# Patient Record
Sex: Female | Born: 1940 | Race: White | Hispanic: No | Marital: Married | Smoking: Former smoker
Health system: Southern US, Community
[De-identification: ages and names within clinical notes are randomized; demographics above are authoritative.]

## PROBLEM LIST (undated history)

## (undated) DIAGNOSIS — K449 Diaphragmatic hernia without obstruction or gangrene: Secondary | ICD-10-CM

## (undated) DIAGNOSIS — K649 Unspecified hemorrhoids: Secondary | ICD-10-CM

## (undated) DIAGNOSIS — K2970 Gastritis, unspecified, without bleeding: Secondary | ICD-10-CM

## (undated) DIAGNOSIS — I10 Essential (primary) hypertension: Secondary | ICD-10-CM

## (undated) DIAGNOSIS — Z87898 Personal history of other specified conditions: Secondary | ICD-10-CM

## (undated) DIAGNOSIS — H409 Unspecified glaucoma: Secondary | ICD-10-CM

## (undated) DIAGNOSIS — Z87442 Personal history of urinary calculi: Secondary | ICD-10-CM

## (undated) DIAGNOSIS — M1990 Unspecified osteoarthritis, unspecified site: Secondary | ICD-10-CM

## (undated) DIAGNOSIS — E785 Hyperlipidemia, unspecified: Secondary | ICD-10-CM

## (undated) DIAGNOSIS — K219 Gastro-esophageal reflux disease without esophagitis: Secondary | ICD-10-CM

## (undated) DIAGNOSIS — F419 Anxiety disorder, unspecified: Secondary | ICD-10-CM

## (undated) DIAGNOSIS — K589 Irritable bowel syndrome without diarrhea: Secondary | ICD-10-CM

## (undated) DIAGNOSIS — K5790 Diverticulosis of intestine, part unspecified, without perforation or abscess without bleeding: Secondary | ICD-10-CM

## (undated) DIAGNOSIS — Z8701 Personal history of pneumonia (recurrent): Secondary | ICD-10-CM

## (undated) HISTORY — DX: Unspecified osteoarthritis, unspecified site: M19.90

## (undated) HISTORY — DX: Essential (primary) hypertension: I10

## (undated) HISTORY — DX: Personal history of urinary calculi: Z87.442

## (undated) HISTORY — DX: Irritable bowel syndrome, unspecified: K58.9

## (undated) HISTORY — DX: Personal history of other specified conditions: Z87.898

## (undated) HISTORY — DX: Unspecified glaucoma: H40.9

## (undated) HISTORY — DX: Anxiety disorder, unspecified: F41.9

## (undated) HISTORY — DX: Unspecified hemorrhoids: K64.9

## (undated) HISTORY — PX: BLADDER REPAIR: SHX76

## (undated) HISTORY — DX: Gastro-esophageal reflux disease without esophagitis: K21.9

## (undated) HISTORY — PX: KIDNEY STONE SURGERY: SHX686

## (undated) HISTORY — DX: Gastritis, unspecified, without bleeding: K29.70

## (undated) HISTORY — PX: CHOLECYSTECTOMY: SHX55

## (undated) HISTORY — PX: APPENDECTOMY: SHX54

## (undated) HISTORY — DX: Diverticulosis of intestine, part unspecified, without perforation or abscess without bleeding: K57.90

## (undated) HISTORY — PX: TONSILLECTOMY: SHX5217

## (undated) HISTORY — DX: Hyperlipidemia, unspecified: E78.5

## (undated) HISTORY — DX: Diaphragmatic hernia without obstruction or gangrene: K44.9

## (undated) HISTORY — DX: Personal history of pneumonia (recurrent): Z87.01

---

## 1998-07-25 ENCOUNTER — Encounter: Payer: Self-pay | Admitting: Urology

## 1998-07-25 ENCOUNTER — Ambulatory Visit (HOSPITAL_COMMUNITY): Admission: RE | Admit: 1998-07-25 | Discharge: 1998-07-25 | Payer: Self-pay | Admitting: Urology

## 1998-07-27 ENCOUNTER — Encounter: Payer: Self-pay | Admitting: Urology

## 1998-07-27 ENCOUNTER — Ambulatory Visit (HOSPITAL_COMMUNITY): Admission: RE | Admit: 1998-07-27 | Discharge: 1998-07-27 | Payer: Self-pay | Admitting: Urology

## 1998-08-15 ENCOUNTER — Encounter: Payer: Self-pay | Admitting: Urology

## 1998-08-15 ENCOUNTER — Ambulatory Visit (HOSPITAL_COMMUNITY): Admission: RE | Admit: 1998-08-15 | Discharge: 1998-08-15 | Payer: Self-pay | Admitting: Urology

## 1998-09-06 ENCOUNTER — Encounter: Payer: Self-pay | Admitting: Urology

## 1998-09-06 ENCOUNTER — Ambulatory Visit (HOSPITAL_COMMUNITY): Admission: RE | Admit: 1998-09-06 | Discharge: 1998-09-06 | Payer: Self-pay | Admitting: Urology

## 1998-09-26 ENCOUNTER — Encounter: Payer: Self-pay | Admitting: Urology

## 1998-09-26 ENCOUNTER — Ambulatory Visit (HOSPITAL_COMMUNITY): Admission: RE | Admit: 1998-09-26 | Discharge: 1998-09-26 | Payer: Self-pay | Admitting: Urology

## 1998-12-20 ENCOUNTER — Other Ambulatory Visit: Admission: RE | Admit: 1998-12-20 | Discharge: 1998-12-20 | Payer: Self-pay | Admitting: Obstetrics and Gynecology

## 1998-12-22 ENCOUNTER — Ambulatory Visit (HOSPITAL_COMMUNITY): Admission: RE | Admit: 1998-12-22 | Discharge: 1998-12-22 | Payer: Self-pay | Admitting: Obstetrics and Gynecology

## 1998-12-22 ENCOUNTER — Encounter: Payer: Self-pay | Admitting: Obstetrics and Gynecology

## 1998-12-29 ENCOUNTER — Ambulatory Visit (HOSPITAL_COMMUNITY): Admission: RE | Admit: 1998-12-29 | Discharge: 1998-12-29 | Payer: Self-pay | Admitting: Obstetrics and Gynecology

## 1998-12-29 ENCOUNTER — Encounter: Payer: Self-pay | Admitting: Obstetrics and Gynecology

## 1999-06-16 ENCOUNTER — Ambulatory Visit (HOSPITAL_COMMUNITY): Admission: RE | Admit: 1999-06-16 | Discharge: 1999-06-16 | Payer: Self-pay | Admitting: Obstetrics and Gynecology

## 1999-06-16 ENCOUNTER — Encounter: Payer: Self-pay | Admitting: Obstetrics and Gynecology

## 1999-12-25 ENCOUNTER — Encounter: Payer: Self-pay | Admitting: Obstetrics and Gynecology

## 1999-12-25 ENCOUNTER — Encounter: Admission: RE | Admit: 1999-12-25 | Discharge: 1999-12-25 | Payer: Self-pay | Admitting: Obstetrics and Gynecology

## 2000-08-12 ENCOUNTER — Other Ambulatory Visit: Admission: RE | Admit: 2000-08-12 | Discharge: 2000-08-12 | Payer: Self-pay | Admitting: Obstetrics and Gynecology

## 2000-09-19 ENCOUNTER — Encounter: Admission: RE | Admit: 2000-09-19 | Discharge: 2000-09-19 | Payer: Self-pay | Admitting: Obstetrics and Gynecology

## 2000-09-19 ENCOUNTER — Encounter: Payer: Self-pay | Admitting: Obstetrics and Gynecology

## 2001-04-01 ENCOUNTER — Ambulatory Visit (HOSPITAL_COMMUNITY): Admission: RE | Admit: 2001-04-01 | Discharge: 2001-04-01 | Payer: Self-pay | Admitting: Obstetrics and Gynecology

## 2001-04-01 ENCOUNTER — Encounter: Payer: Self-pay | Admitting: Obstetrics and Gynecology

## 2001-12-29 ENCOUNTER — Encounter: Payer: Self-pay | Admitting: Cardiovascular Disease

## 2001-12-29 ENCOUNTER — Ambulatory Visit (HOSPITAL_COMMUNITY): Admission: RE | Admit: 2001-12-29 | Discharge: 2001-12-29 | Payer: Self-pay | Admitting: Cardiovascular Disease

## 2003-04-08 ENCOUNTER — Ambulatory Visit (HOSPITAL_COMMUNITY): Admission: RE | Admit: 2003-04-08 | Discharge: 2003-04-08 | Payer: Self-pay | Admitting: Obstetrics and Gynecology

## 2003-04-08 ENCOUNTER — Encounter: Payer: Self-pay | Admitting: Obstetrics and Gynecology

## 2003-04-09 ENCOUNTER — Other Ambulatory Visit: Admission: RE | Admit: 2003-04-09 | Discharge: 2003-04-09 | Payer: Self-pay | Admitting: Internal Medicine

## 2005-04-02 ENCOUNTER — Emergency Department (HOSPITAL_COMMUNITY): Admission: EM | Admit: 2005-04-02 | Discharge: 2005-04-02 | Payer: Self-pay | Admitting: Emergency Medicine

## 2006-02-13 ENCOUNTER — Ambulatory Visit (HOSPITAL_COMMUNITY): Admission: RE | Admit: 2006-02-13 | Discharge: 2006-02-13 | Payer: Self-pay | Admitting: Obstetrics and Gynecology

## 2007-03-25 ENCOUNTER — Ambulatory Visit (HOSPITAL_COMMUNITY): Admission: RE | Admit: 2007-03-25 | Discharge: 2007-03-25 | Payer: Self-pay | Admitting: *Deleted

## 2007-04-01 ENCOUNTER — Ambulatory Visit: Payer: Self-pay | Admitting: Gastroenterology

## 2007-04-01 LAB — CONVERTED CEMR LAB
ALT: 19 units/L (ref 0–35)
Albumin: 3.8 g/dL (ref 3.5–5.2)
Alkaline Phosphatase: 60 units/L (ref 39–117)
BUN: 15 mg/dL (ref 6–23)
Basophils Relative: 1.7 % — ABNORMAL HIGH (ref 0.0–1.0)
Calcium: 9.2 mg/dL (ref 8.4–10.5)
Creatinine, Ser: 1 mg/dL (ref 0.4–1.2)
Eosinophils Absolute: 0.2 10*3/uL (ref 0.0–0.6)
Eosinophils Relative: 2.4 % (ref 0.0–5.0)
Folate: 10.6 ng/mL
GFR calc Af Amer: 72 mL/min
HCT: 40.5 % (ref 36.0–46.0)
MCV: 87.6 fL (ref 78.0–100.0)
Platelets: 200 10*3/uL (ref 150–400)
Potassium: 4.5 meq/L (ref 3.5–5.1)
RBC: 4.62 M/uL (ref 3.87–5.11)
RDW: 12.6 % (ref 11.5–14.6)
TSH: 2.59 microintl units/mL (ref 0.35–5.50)
Total Bilirubin: 0.8 mg/dL (ref 0.3–1.2)
WBC: 9.1 10*3/uL (ref 4.5–10.5)

## 2007-04-21 ENCOUNTER — Ambulatory Visit: Payer: Self-pay | Admitting: Gastroenterology

## 2007-04-21 ENCOUNTER — Encounter: Payer: Self-pay | Admitting: Gastroenterology

## 2007-04-21 DIAGNOSIS — K2970 Gastritis, unspecified, without bleeding: Secondary | ICD-10-CM

## 2007-04-21 DIAGNOSIS — K5790 Diverticulosis of intestine, part unspecified, without perforation or abscess without bleeding: Secondary | ICD-10-CM

## 2007-04-21 DIAGNOSIS — K449 Diaphragmatic hernia without obstruction or gangrene: Secondary | ICD-10-CM

## 2007-04-21 HISTORY — DX: Diaphragmatic hernia without obstruction or gangrene: K44.9

## 2007-04-21 HISTORY — DX: Diverticulosis of intestine, part unspecified, without perforation or abscess without bleeding: K57.90

## 2007-04-21 HISTORY — DX: Gastritis, unspecified, without bleeding: K29.70

## 2007-04-21 LAB — CONVERTED CEMR LAB: Tissue Transglutaminase Ab, IgA: 0 units (ref <7)

## 2007-05-15 ENCOUNTER — Ambulatory Visit: Payer: Self-pay | Admitting: Gastroenterology

## 2007-05-15 LAB — CONVERTED CEMR LAB
Anti Nuclear Antibody(ANA): NEGATIVE
Complement C4, Body Fluid: 30 mg/dL (ref 16–47)

## 2007-05-19 ENCOUNTER — Ambulatory Visit (HOSPITAL_COMMUNITY): Admission: RE | Admit: 2007-05-19 | Discharge: 2007-05-19 | Payer: Self-pay | Admitting: Urology

## 2007-05-19 ENCOUNTER — Encounter: Payer: Self-pay | Admitting: Gastroenterology

## 2007-06-10 ENCOUNTER — Ambulatory Visit: Payer: Self-pay | Admitting: Cardiology

## 2007-07-08 ENCOUNTER — Ambulatory Visit: Payer: Self-pay | Admitting: Cardiology

## 2007-09-11 LAB — HM COLONOSCOPY: HM COLON: NORMAL

## 2008-06-04 ENCOUNTER — Ambulatory Visit: Payer: Self-pay | Admitting: Internal Medicine

## 2008-06-04 DIAGNOSIS — R05 Cough: Secondary | ICD-10-CM

## 2008-06-04 LAB — CONVERTED CEMR LAB
Basophils Absolute: 0.2 10*3/uL — ABNORMAL HIGH (ref 0.0–0.1)
Basophils Relative: 1.2 % (ref 0.0–3.0)
Eosinophils Absolute: 0.2 10*3/uL (ref 0.0–0.7)
Eosinophils Relative: 1.8 % (ref 0.0–5.0)
Monocytes Absolute: 1.1 10*3/uL — ABNORMAL HIGH (ref 0.1–1.0)
Neutro Abs: 8.8 10*3/uL — ABNORMAL HIGH (ref 1.4–7.7)
Neutrophils Relative %: 68.8 % (ref 43.0–77.0)
Platelets: 172 10*3/uL (ref 150–400)
RDW: 13 % (ref 11.5–14.6)

## 2008-06-05 ENCOUNTER — Encounter: Payer: Self-pay | Admitting: Internal Medicine

## 2008-06-05 DIAGNOSIS — K219 Gastro-esophageal reflux disease without esophagitis: Secondary | ICD-10-CM

## 2008-06-05 DIAGNOSIS — R911 Solitary pulmonary nodule: Secondary | ICD-10-CM | POA: Insufficient documentation

## 2008-06-05 DIAGNOSIS — Z87442 Personal history of urinary calculi: Secondary | ICD-10-CM

## 2008-06-05 DIAGNOSIS — I10 Essential (primary) hypertension: Secondary | ICD-10-CM | POA: Insufficient documentation

## 2008-06-05 DIAGNOSIS — K5730 Diverticulosis of large intestine without perforation or abscess without bleeding: Secondary | ICD-10-CM | POA: Insufficient documentation

## 2008-06-05 DIAGNOSIS — Z8719 Personal history of other diseases of the digestive system: Secondary | ICD-10-CM

## 2008-06-05 DIAGNOSIS — T783XXA Angioneurotic edema, initial encounter: Secondary | ICD-10-CM | POA: Insufficient documentation

## 2008-06-08 ENCOUNTER — Ambulatory Visit: Payer: Self-pay | Admitting: Internal Medicine

## 2008-06-08 ENCOUNTER — Ambulatory Visit: Payer: Self-pay | Admitting: Cardiology

## 2008-07-13 ENCOUNTER — Telehealth: Payer: Self-pay | Admitting: Internal Medicine

## 2008-07-19 ENCOUNTER — Ambulatory Visit: Payer: Self-pay | Admitting: Cardiovascular Disease

## 2008-07-20 ENCOUNTER — Telehealth: Payer: Self-pay | Admitting: Internal Medicine

## 2009-04-05 ENCOUNTER — Ambulatory Visit: Payer: Self-pay | Admitting: Internal Medicine

## 2009-04-05 ENCOUNTER — Encounter: Payer: Self-pay | Admitting: Internal Medicine

## 2009-04-06 ENCOUNTER — Telehealth: Payer: Self-pay | Admitting: Internal Medicine

## 2010-02-20 ENCOUNTER — Telehealth: Payer: Self-pay | Admitting: Internal Medicine

## 2010-02-20 ENCOUNTER — Ambulatory Visit: Payer: Self-pay | Admitting: Internal Medicine

## 2010-06-03 ENCOUNTER — Emergency Department (HOSPITAL_COMMUNITY): Admission: EM | Admit: 2010-06-03 | Discharge: 2010-06-03 | Payer: Self-pay | Admitting: Emergency Medicine

## 2010-06-26 ENCOUNTER — Ambulatory Visit (HOSPITAL_COMMUNITY): Admission: RE | Admit: 2010-06-26 | Discharge: 2010-06-26 | Payer: Self-pay | Admitting: Obstetrics and Gynecology

## 2010-07-18 ENCOUNTER — Encounter: Payer: Self-pay | Admitting: Internal Medicine

## 2010-07-18 ENCOUNTER — Ambulatory Visit (HOSPITAL_COMMUNITY): Admission: RE | Admit: 2010-07-18 | Discharge: 2010-07-18 | Payer: Self-pay | Admitting: Urology

## 2010-07-31 ENCOUNTER — Encounter: Payer: Self-pay | Admitting: Internal Medicine

## 2010-10-02 ENCOUNTER — Encounter
Admission: RE | Admit: 2010-10-02 | Discharge: 2010-10-02 | Payer: Self-pay | Source: Home / Self Care | Attending: Obstetrics and Gynecology | Admitting: Obstetrics and Gynecology

## 2010-10-08 LAB — CONVERTED CEMR LAB
BUN: 12 mg/dL (ref 6–23)
Calcium: 9.5 mg/dL (ref 8.4–10.5)
Creatinine, Ser: 0.87 mg/dL (ref 0.40–1.20)

## 2010-10-10 NOTE — Letter (Signed)
Summary: [facility]   Imported By: Lester Hulett 07/24/2010 10:34:39  _____________________________________________________________________  External Attachment:    Type:   Image     Comment:   External Document

## 2010-10-10 NOTE — Assessment & Plan Note (Signed)
Summary: BRONCHITIS/NWS   Vital Signs:  Patient profile:   70 year old female Height:      65 inches Weight:      159 pounds BMI:     26.55 O2 Sat:      95 % on Room air Temp:     97.9 degrees F oral Pulse rate:   70 / minute BP sitting:   142 / 80  (left arm) Cuff size:   regular  Vitals Entered By: Bill Salinas CMA (February 20, 2010 4:51 PM)  O2 Flow:  Room air CC: pt here with c/o productive cough and chest congestion. she also wants to get Tetanus shot today/ ab   Primary Care Provider:  Jacques Navy MD  CC:  pt here with c/o productive cough and chest congestion. she also wants to get Tetanus shot today/ ab.  History of Present Illness: Patient presents c/o a change in the left breast - no specific lump, but different.   She reports a persistent cough productive of clear phlegm. She feels it is coming from deep in the left lung.   Current Medications (verified): 1)  Amlodipine Besylate 10 Mg Tabs (Amlodipine Besylate) .... Take 1 Tablet By Mouth Once A Day 2)  Timoptic 0.5 % Soln (Timolol Maleate) .... One Drop Each Eye At Bedtime 3)  Avelox Abc Pack 400 Mg Tabs (Moxifloxacin Hcl) .... As Directed1 4)  Promethazine-Codeine 6.25-10 Mg/21ml Syrp (Promethazine-Codeine) .Marland Kitchen.. 1 Tsp Q 6 As Needed Cough 5)  Prednisone 20 Mg Tabs (Prednisone) .... Q1 By Mouth Once Daily X 7 For Inflammation of The Airway 6)  Tessalon Perles 100 Mg Caps (Benzonatate) .Marland Kitchen.. 1 By Mouth Three Times A Day For Cough 7)  Aleve Otc .... As Needed 8)  Furosemide 20 Mg Tabs (Furosemide) .... 1/4 Tablet Q 6 As Needed For Peripheral Edema  Allergies (verified): 1)  ! 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  The patient denies anorexia, fever, weight loss, weight gain, decreased hearing, chest pain, peripheral edema, headaches, severe indigestion/heartburn, muscle weakness, difficulty walking, abnormal bleeding, and enlarged lymph nodes.    Physical Exam  General:  Pleasant older white female in no distress Head:  normocephalic and atraumatic.   Eyes:  pupils equal, pupils round, corneas and lenses clear, and no injection.   Neck:  supple.   Breasts:  left breast: normal skin, nipple without discharge, fibrocystic changes throughout. No fixed mass or lesion.  Lungs:  normal respiratory effort, no intercostal retractions, no accessory muscle use, normal breath sounds, no crackles, and no wheezes.  She does have a cough Heart:  normal rate and regular rhythm.   Neurologic:  alert & oriented X3 and cranial nerves II-XII intact.   Skin:  turgor normal and color normal.   Cervical Nodes:  no anterior cervical adenopathy and no posterior cervical adenopathy.   Axillary Nodes:  no L axillary adenopathy.   Psych:  Oriented X3 and memory intact for recent and remote.     Impression & Recommendations:  Problem # 1:  COUGH (ICD-786.2) No evidence of bacterial infection on exam. CXR without infiltrate or effusion. Chronic bronchitic chagnes noted.  Plan - no evidence of bacerial infection           supportive care.   Problem # 2:  Screening Breast Cancer (ICD-V76.10) Patient had thought she hade a nodule left breast. Exam was  unremarkable - no fixed nodule apreciated.  PLan - move ahead with mammogram.  Complete Medication List: 1)  Amlodipine Besylate 10 Mg Tabs (Amlodipine besylate) .... Take 1 tablet by mouth once a day 2)  Timoptic 0.5 % Soln (Timolol maleate) .... One drop each eye at bedtime 3)  Avelox Abc Pack 400 Mg Tabs (Moxifloxacin hcl) .... As directed1 4)  Promethazine-codeine 6.25-10 Mg/73ml Syrp (Promethazine-codeine) .Marland Kitchen.. 1 tsp q 6 as needed cough 5)  Prednisone 20 Mg Tabs (Prednisone) .... Q1 by mouth once daily x 7 for inflammation of the airway 6)  Tessalon Perles 100 Mg Caps (Benzonatate) .Marland Kitchen.. 1 by mouth three times a day for cough 7)  Aleve Otc  .... As needed 8)  Furosemide 20 Mg Tabs (Furosemide) .... 1/4 tablet q 6 as needed for peripheral edema  Patient Instructions: 1)  cough - no evidence of respiratory infection: no fever, no productive sputum, lungs are clear. Suspect post-nasal drainage. No indication for antibiotics. Plan - promethazine with codeine cough syrup, over the counter loratadine 10mg  once a day. 2)  Breast - normal exam except for fibrocystic type changes.  3)  Please return at you convenience for a tetnus shot.   G CHEST 2 VIEW - [id number]   Clinical Data: Cough, smoker   CHEST - 2 VIEW   Comparison: Chest radiograph 04/05/2009   Findings: Normal mediastinum and cardiac silhouette.  Costophrenic angles are clear.  No evidence effusion, infiltrate, or pneumothorax.  There is bronchitic change centrally which is similar to prior.  Lungs are slightly hyperinflated.  There is generative change of the lower thoracic spine.   IMPRESSION:   1.  No acute cardiopulmonary process. 2.  Bronchitic change and hyperinflation.   Read By:  Genevive Bi,  M.D. Prescriptions: PROMETHAZINE-CODEINE 6.25-10 MG/5ML SYRP (PROMETHAZINE-CODEINE) 1 tsp q 6 as needed cough  #8 oz x 2   Entered and Authorized by:   Jacques Navy MD   Signed by:   Jacques Navy MD on  02/20/2010   Method used:   Print then Give to Patient   RxID:   [id number]

## 2010-10-10 NOTE — Letter (Signed)
[facility]   Imported By: Sherian Rein 08/09/2010 09:05:17  _____________________________________________________________________  External Attachment:    Type:   Image     Comment:   External Document

## 2010-10-31 ENCOUNTER — Telehealth: Payer: Self-pay | Admitting: Cardiology

## 2010-11-07 NOTE — Progress Notes (Signed)
Summary: B/P running high  Medications Added XALATAN 0.005 % SOLN (LATANOPROST) take as directed       Phone Note Call from Patient Call back at Home Phone [phone]   Caller: Patient Summary of Call: Pt B/P running high 155/95 today pt having headache Initial call taken by: Judie Grieve,  October 31, 2010 4:42 PM  Follow-up for Phone Call        I spoke with pt she started having headache 3 weeks ago.  Her blood pressure has been 150/90 -160/95.  She does note that her glaucoma pressures have been high at 20-24.  She is starting Xalatan for this in addition to Timolol.    She is not having a headache at this time. Encouraged to follow-up with pcp but she refuses. Follow-up by: Lisabeth Devoid RN,  October 31, 2010 5:26 PM  Additional Follow-up for Phone Call Additional follow up Details #1::        I talked with patient today and she is feeling better. She will follow-up with Dr. Daleen Squibb on 11/24/10. Pt does not want to see pcp about bp.   As she says she will relax, watch her sodium intake, and caffeine.She will call back if she has any further problems prior to appt.  Additional Follow-up by: Lisabeth Devoid RN,  November 01, 2010 9:16 AM    New/Updated Medications: XALATAN 0.005 % SOLN (LATANOPROST) take as directed

## 2010-11-23 LAB — POCT I-STAT, CHEM 8
Calcium, Ion: 1.19 mmol/L (ref 1.12–1.32)
Chloride: 109 mEq/L (ref 96–112)
Glucose, Bld: 88 mg/dL (ref 70–99)
HCT: 46 % (ref 36.0–46.0)
TCO2: 25 mmol/L (ref 0–100)

## 2010-11-23 LAB — URINALYSIS, ROUTINE W REFLEX MICROSCOPIC
Nitrite: NEGATIVE
Protein, ur: NEGATIVE mg/dL
Specific Gravity, Urine: 1.005 (ref 1.005–1.030)
Urobilinogen, UA: 0.2 mg/dL (ref 0.0–1.0)

## 2010-11-23 LAB — URINE MICROSCOPIC-ADD ON

## 2010-11-23 LAB — URINE CULTURE
Colony Count: NO GROWTH
Culture: NO GROWTH

## 2010-11-24 ENCOUNTER — Encounter: Payer: Self-pay | Admitting: Cardiology

## 2010-11-24 ENCOUNTER — Encounter (INDEPENDENT_AMBULATORY_CARE_PROVIDER_SITE_OTHER): Payer: Medicare Other | Admitting: Cardiology

## 2010-11-24 DIAGNOSIS — I6529 Occlusion and stenosis of unspecified carotid artery: Secondary | ICD-10-CM | POA: Insufficient documentation

## 2010-11-24 DIAGNOSIS — I10 Essential (primary) hypertension: Secondary | ICD-10-CM

## 2010-11-27 ENCOUNTER — Ambulatory Visit: Payer: Self-pay | Admitting: Cardiology

## 2010-11-27 ENCOUNTER — Other Ambulatory Visit: Payer: Self-pay | Admitting: Cardiology

## 2010-11-27 DIAGNOSIS — I6529 Occlusion and stenosis of unspecified carotid artery: Secondary | ICD-10-CM

## 2010-11-28 NOTE — Assessment & Plan Note (Signed)
Summary: np6/dfg  Medications Added ASPIRIN 81 MG TBEC (ASPIRIN) Take one tablet by mouth daily        Visit Type:  Initial Consult Primary Provider:  Jacques Navy MD  CC:  HTN concerns.pt has no other complaints..  History of Present Illness: [name] comes today for evaluation of HTN. he had one episode when it measured 169/100. She was having a very stressful day and had a headache. No visual changes. She has ben on amlodopine for a long time. Her BP is usually under good control.  She still smokes a half of pack of cigs. She has not had her lipids checked in a long time. She describes a brief R visual field cut 4 years ago. No recurrence.  No angina or CP. EKG today shows ST change in V1 adnV2  Current Medications (verified): 1)  Amlodipine Besylate 10 Mg Tabs (Amlodipine Besylate) .... Take 1 Tablet By Mouth Once A Day 2)  Timoptic 0.5 % Soln (Timolol Maleate) .... One Drop Each Eye At Bedtime 3)  Furosemide 20 Mg Tabs (Furosemide) .... 1/4 Tablet Q 6 As Needed For Peripheral Edema 4)  Xalatan 0.005 % Soln (Latanoprost) .... Take As Directed  Allergies: 1)  ! Pcn 2)  ! 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.01) UNSPECIFIED ESSENTIAL HYPERTENSION (ICD-401.9) COUGH (ICD-786.2)  Past Surgical History: Last updated: 12/04/2007 Cholecystectomy  Family History: Last updated: Jun 23, 2008 Mother - deceased - pancreatic cancer Brother-deceased- pancreastic cancer Neg - breast or colon cancer; CAD, DM  Social History: Last updated: 2008/06/23 HSG, 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 Head:  normocephalic and atraumatic Eyes:  PERRLA/EOM intact; conjunctiva and lids normal. Neck:  Neck supple, no JVD. No masses, thyromegaly or abnormal cervical nodes. Chest Barre Aydelott:  no deformities or breast masses noted Lungs:  Clear bilaterally to auscultation and percussion. Heart:  PMI is normal, nl S1and S2, No murmur, No S4, R carotid bruit. Abdomen:  Bowel sounds positive; abdomen soft and non-tender without masses, organomegaly, or hernias noted. No hepatosplenomegaly. Msk:  Back normal, normal gait. Muscle strength and tone normal. Pulses:  pulses normal in all 4 extremities Extremities:  No clubbing or cyanosis. Neurologic:  Alert and oriented x 3. Skin:  Intact without lesions or rashes. Psych:  Normal affect.   Impression & Recommendations:  Problem # 1:  CAROTID ARTERY DISEASE (ICD-433.10) Assessment New  Her updated medication list for this problem includes:    Aspirin 81 Mg Tbec (Aspirin) .Marland Kitchen... Take one tablet by mouth daily  Orders: Carotid Duplex (Carotid Duplex)  Problem # 2:  UNSPECIFIED ESSENTIAL HYPERTENSION (ICD-401.9)  Her updated medication list for this problem includes:    Amlodipine Besylate 10 Mg  Tabs (Amlodipine besylate) .Marland Kitchen... Take 1 tablet by mouth once a day    Furosemide 20 Mg Tabs (Furosemide) .Marland Kitchen... 1/4 tablet q 6 as needed for peripheral edema    Aspirin 81 Mg Tbec (Aspirin) .Marland Kitchen... Take one tablet by mouth daily  Orders: EKG w/ Interpretation (93000)  Patient Instructions: 1)  Your physician recommends that you schedule a follow-up appointment in: as needed with Dr. Daleen Squibb 2)  Your physician recommends that you return for a FASTING lipid  profile, lipid, bmet 433.10, 401.9/dfg  SAME DAY AS CAROTIDS 3)  Your physician has recommended you make the following change in your medication:  4)  Your physician has requested that you have a carotid duplex. This test is an ultrasound of the carotid arteries in your neck. It looks at blood flow through these arteries that supply the brain with blood. Allow one hour for this exam. There are no restrictions or special instructions. 5)  Your physician discussed the hazards of tobacco use.  Tobacco use cessation is recommended and techniques and options to help you quit were discussed.

## 2010-12-06 ENCOUNTER — Other Ambulatory Visit: Payer: Medicare Other | Admitting: *Deleted

## 2010-12-06 ENCOUNTER — Encounter: Payer: Medicare Other | Admitting: *Deleted

## 2010-12-18 ENCOUNTER — Telehealth: Payer: Self-pay | Admitting: *Deleted

## 2010-12-18 NOTE — Telephone Encounter (Signed)
Refill request from [facility] on [address] refill for promethazine/codeine syr. 240. SIG take 1 teaspoonful by mouth every 6 hours as needed for cough. Last filled 07/31/2010.Pt states she has had coughing spells and this helped her before. She just wants one refill

## 2010-12-18 NOTE — Telephone Encounter (Signed)
Ok for refill with one additional

## 2010-12-19 MED ORDER — PROMETHAZINE-CODEINE 6.25-10 MG/5ML PO SYRP: 5.0000 mL | ORAL_SOLUTION | Freq: Four times a day (QID) | ORAL | Status: AC | PRN

## 2010-12-26 ENCOUNTER — Encounter: Payer: Self-pay | Admitting: Cardiology

## 2010-12-26 ENCOUNTER — Other Ambulatory Visit: Payer: Self-pay | Admitting: *Deleted

## 2010-12-26 MED ORDER — AMLODIPINE BESYLATE 10 MG PO TABS: 10.0000 mg | ORAL_TABLET | Freq: Every day | ORAL | Status: DC

## 2011-01-02 ENCOUNTER — Other Ambulatory Visit (INDEPENDENT_AMBULATORY_CARE_PROVIDER_SITE_OTHER): Payer: Medicare Other | Admitting: *Deleted

## 2011-01-02 ENCOUNTER — Other Ambulatory Visit: Payer: Self-pay | Admitting: *Deleted

## 2011-01-02 ENCOUNTER — Encounter (INDEPENDENT_AMBULATORY_CARE_PROVIDER_SITE_OTHER): Payer: Medicare Other | Admitting: *Deleted

## 2011-01-02 DIAGNOSIS — I6529 Occlusion and stenosis of unspecified carotid artery: Secondary | ICD-10-CM

## 2011-01-02 DIAGNOSIS — I10 Essential (primary) hypertension: Secondary | ICD-10-CM

## 2011-01-02 DIAGNOSIS — Z13220 Encounter for screening for lipoid disorders: Secondary | ICD-10-CM

## 2011-01-02 LAB — LIPID PANEL
Cholesterol: 280 mg/dL — ABNORMAL HIGH (ref 0–200)
HDL: 46.8 mg/dL (ref >39.00)
Total CHOL/HDL Ratio: 6
VLDL: 30.2 mg/dL (ref 0.0–40.0)

## 2011-01-02 LAB — BASIC METABOLIC PANEL
Chloride: 108 mEq/L (ref 96–112)
GFR: 68.84 mL/min (ref >60.00)
Glucose, Bld: 80 mg/dL (ref 70–99)
Potassium: 4 mEq/L (ref 3.5–5.1)
Sodium: 141 mEq/L (ref 135–145)

## 2011-01-02 LAB — HEPATIC FUNCTION PANEL
Albumin: 3.7 g/dL (ref 3.5–5.2)
Total Protein: 6.2 g/dL (ref 6.0–8.3)

## 2011-01-02 LAB — LDL CHOLESTEROL, DIRECT: Direct LDL: 182.5 mg/dL

## 2011-01-09 ENCOUNTER — Telehealth: Payer: Self-pay | Admitting: *Deleted

## 2011-01-09 DIAGNOSIS — E785 Hyperlipidemia, unspecified: Secondary | ICD-10-CM

## 2011-01-09 MED ORDER — ATORVASTATIN CALCIUM 80 MG PO TABS: 80.0000 mg | ORAL_TABLET | Freq: Every day | ORAL | Status: AC

## 2011-01-09 NOTE — Telephone Encounter (Signed)
Pt aware of cholesterol results and recommendations. Pres[facility] to [facility] on [address].  She will come back on June 12 th for fasting labs. Mylo Red RN

## 2011-01-09 NOTE — Telephone Encounter (Signed)
Message copied by Lisabeth Devoid on Tue Jan 09, 2011  9:52 AM ------      Message from: Valera Castle      Created: Fri Jan 05, 2011  9:44 AM       Chol and LDL extremely high. Start Statin with atorvastatin 80 mg/d. Check labs in6 weeks.

## 2011-01-23 NOTE — Assessment & Plan Note (Signed)
[facility]                            CARDIOLOGY OFFICE NOTE   Shelly [name], Shelly [name] Shelly [name]                       MRN:          [id number]  DATE:06/10/2007                            DOB:          01/20/1941    I was asked to consult on Shelly [name] for hypertension.   HISTORY OF PRESENT ILLNESS:  She is 70 years of age, married and has 2  children.  She has always enjoyed low blood pressure until recently.  She has been under a lot of stress since her twin brother died of  pancreatic cancer about a year and a half ago.  She has noted  progressive rise in her blood pressure since.   She has been seen recently by Dr. Sheryn Bison for chronic abdominal  pain.  She has also has a history of angioedema which is poorly  characterized in 1992.  She said this was due to stress.   Dr. Jarold Motto has obtained 24-hour urines for porphyria which was  negative.  He also obtained complement anti-nuclear antibody,  trypsinogen, CA 19-9 all of which were within normal limits or negative.  In addition he has performed a comprehensive metabolic panel with CBC,  TSH, folate and B-12 all in July of this year which were negative.   She denies any headaches, nausea, vomiting, chest discomfort, shortness  of breath, tachy palpitations, orthopnea, PND or peripheral edema.  She  says she thinks the biggest reason her blood pressure is up is that she  is stressed out all the time about her animals.  She worries incessantly  about her cats, for instance, being [place].   PAST MEDICAL HISTORY:  SHE IS INTOLERANT OF ASPIRIN AND PENICILLIN.  There is a history of angioedema but no known reaction to an ACE  inhibitor or an ARB.  She does smoke about a 1/2 pack a day for 24  years.  She does not drink alcohol, she is not using recreational  products.  She drinks 1-1/2 cup of coffee in the morning and evening.  She enjoys walking.  She plays [employer].   FAMILY  HISTORY:  Negative for premature coronary disease.   CURRENT MEDICATIONS:  Timolol for glaucoma at night.   PREVIOUS SURGERIES:  She has had kidney stones 4 years ago, bladder  repair 10 years ago, appendectomy at age 28.   SOCIAL HISTORY:  She is retired.  She is married.  [place]  She has 2 children and multiple grandchildren.   REVIEW OF SYSTEMS:  Negative except for chronic anxiety and a history of  a hiatal hernia.   Her blood pressure today was 188/98, her pulse 62 and regular, 5 foot 4,  weight is 144 pounds.  She is in no acute distress.  SKIN:  Warm and dry.  HEENT:  Normocephalic/atraumatic, PERRLA, extraocular movements intact,  sclerae are clear, facial symmetry is normal.  NECK:  Supple, carotid upstrokes are equal bilaterally without bruits,  there is no JVD, thyroid is not enlarged.  LUNGS:  Clear.  HEART:  Reveals a regular rate and rhythm without an S4.  ABDOMINAL:  Soft with good bowel sounds, no midline bruit.  There is no  flank bruits.  EXTREMITIES:  Reveal no cyanosis, clubbing or edema.  Pulses are intact,  skin is unremarkable.  MUSCULOSKELETAL:  Negative.   EKG shows normal sinus rhythm with perhaps some right atrial  enlargement.   ASSESSMENT:  1. Probable essential hypertension.  2. Chronic anxiety.  3. Labile blood pressure, probably secondary to #2.   RECOMMENDATIONS:  1. Clean catch urinalysis.  2. 2D echocardiogram.  3. Begin amlodipine 5 mg a day.  She is very worried about side      effects but I think this would be the best tolerated drug.  I      really do not want to use a beta blocker with her on Timolol, nor      do I think it would work well.  In addition, I do not want to use      an ACE inhibitor with her history of angioedema.  I will see her      back in in about 3 weeks.     Thomas C. Daleen Squibb, MD, [facility]  Electronically Signed    TCW/MedQ  DD: 06/10/2007  DT: 06/10/2007  Job #: [id number]   cc:   Rosalyn Gess. Norins, MD  Vania Rea. Jarold Motto, MD, [facility], FAGA

## 2011-01-23 NOTE — Assessment & Plan Note (Signed)
[facility]                            CARDIOLOGY OFFICE NOTE   Linlee, Cromie CADI RHINEHART                       MRN:          [id number]  DATE:07/08/2007                            DOB:          19-Jan-1941    Ms. Sockwell returns today for further management of her essential  hypertension.   After starting her on 5 mg of Norvasc, she began to have problems with  headaches and her blood pressure actually was as high as 190/95.  We  increased her Norvasc to 10 mg.  Dr. Tenny Craw offered to see her that day,  but she refused.  Of note, she also did not show for a 2-D  echocardiogram.  I do not have urinalysis on her either.   Since that time, she has been feeling remarkably better.  Other than  some problems with colitis, her blood pressures have been running around  130/70.  Occasionally it increases to 150-160.   Her Norvasc is now at 10 mg a day.  I have advised her to take it in the  morning.   PHYSICAL EXAMINATION:  VITAL SIGNS:  Her blood pressure today is 134/70,  pulse 64 and regular.  Weight is 145.  HEENT:  Unchanged.  NECK:  Carotids are full without bruits.  LUNGS:  Clear.  HEART:  Regular rate and rhythm.  No gallop.  ABDOMEN:  Soft.  Good bowel sounds.  EXTREMITIES:  No edema.  Pulses are intact.  NEUROLOGIC:  Intact.   I have answered all of Ms. Rigaud' questions today.  We will continue  with amlodipine 10 mg a day.  I will refer her colitis questions to Dr.  Jarold Motto.  I will see her back on a p.r.n. basis.     Thomas C. Daleen Squibb, MD, [facility]  Electronically Signed    TCW/MedQ  DD: 07/08/2007  DT: 07/08/2007  Job #: [id number]

## 2011-01-23 NOTE — Assessment & Plan Note (Signed)
[facility]                         GASTROENTEROLOGY OFFICE NOTE   Shelly, [name]                        MRN:          [id number]  DATE:04/01/2007                            DOB:          1941/02/14    Shelly [name] is a 70 year old white female who is self-referred today for  evaluation of diffuse abdominal pain.   In the last month, this patient has had onset of almost daily nausea  with recurrent cramping diffuse abdominal pain, which is mostly however  located in the upper abdomen radiating around both upper quadrants and  into her back. It has no real precipitating or alleviating elements, but  does occur several times a day with nausea, but no emesis. There has  been no real change in her bowel habits, melena or hematochezia. She  does not complain of dysphagia, dyspepsia or any specific hepatobiliary  complaints. She does have a very strong family history of pancreatic  carcinoma in both her mother and her twin brother. Her appetite is good  and her weight is stable.   She recently saw Dr.  Patsi Sears and had a CT scan of her abdomen, which  showed small right renal cyst and some questionable edema of the  descending and sigmoid colon. I do not have any lab data from that  visit.   She follows a regular diet and her appetite is good. She has no fever,  chills, skin rashes, joint pains, or systemic complaints. I previously  have seen this lady in 2004 and she was scheduled for endoscopy and  colonoscopy, but said that she started drinking goat's milk and that  all of her symptoms went away. She never completed her endoscopic  examinations. She did have a flexible sigmoidoscopy some ten years ago.  She has regular gynecologic check ups by Dr.  Earlene Plater. She is status post  cholecystectomy in 1992 by Dr.  Francina Ames. She has chronic left UPJ  obstruction and is followed by Dr.  Patsi Sears.   PAST MEDICAL HISTORY:  Otherwise, is remarkable  for:  1. Degenerative arthritis.  2. Recurrent kidney stones.  3. She has not had any cardiovascular or abdominal surgeries      otherwise.   FAMILY HISTORY:  Remarkable as above for two family members with  pancreatic cancer.   SOCIAL HISTORY:  She is married and lives with her husband. She used to  work as a Actor at [employer]. She does smoke at least a pack of  cigarettes per day, but denies ethanol use.   REVIEW OF SYSTEMS:  Remarkable for recurrent hematuria and chronic back  pain felt from a urologic source. She denies any current cardiovascular,  pulmonary, neurologic, or neuro psychiatric problems. Review of systems  is otherwise is noncontributory.   PHYSICAL EXAMINATION:  She is a healthy-appearing white female in no  distress appearing her stated age. She is 5 feet, 4 inches tall. Weighs  145 pounds. Blood pressure 142/72. Pulse 76 and regular.  I could not appreciate stigmata of chronic liver disease or thyromegaly.  CHEST: Was entirely clear.  She was  in a regular rhythm without murmurs, gallops or rubs.  I could not appreciate hepatosplenomegaly, abdominal masses or  significant tenderness. Bowel sounds were normal.  Peripheral extremities were unremarkable.  Mental status was clear.  RECTAL: Showed no masses or tenderness with guaiac negative stool.   ASSESSMENT:  Mrs. Ellithorpe possibly has some subacute diverticulitis  although I really cannot get a history of too much bowel irregularity.  Her symptoms are very atypical and I suspect that she has probably some  adhesions related to her previous surgeries with associated irritable  bowel syndrome.   RECOMMENDATIONS:  1. Empirically treat her for diverticulitis with Cipro 500 mg twice a      day and metronidazole 500 mg twice a day for ten days with p.r.n.      Darvocet N100 use.  2. The patient will need outpatient endoscopy and colonoscopy which      have not been completed.  3. Screening laboratory  parameters.  4. Continue followup with Dr.  Patsi Sears as previously planned.     Vania Rea. Jarold Motto, MD, [facility], FAGA  Electronically Signed    DRP/MedQ  DD: 04/01/2007  DT: 04/01/2007  Job #: [id number]   cc:   Lynelle Smoke I. Patsi Sears, M.D.  Rosalyn Gess Norins, MD  Chester Holstein. Earlene Plater, M.D.

## 2011-01-23 NOTE — Assessment & Plan Note (Signed)
[facility]                         GASTROENTEROLOGY OFFICE NOTE   SHANTAL, ROAN                       MRN:          [id number]  DATE:05/15/2007                            DOB:          09/02/1941    [name] continues to complain of a diffuse abdominal cramping in all  quadrants and into her back.  She has had a normal CT scan, lab data,  physical exam, endoscopy, and colonoscopy, except for mild  diverticulosis.  All of her lab tests were normal.  Her abdominal exam  remains completely unremarkable without hepatosplenomegaly, abdominal  masses, or tenderness.   On reviewing her chart, it is obvious that the patient has had chronic  abdominal pain going back some 20 years.  She is status post  cholecystectomy.  I did find a record where she was hospitalized once in  1992 with angioedema, and I am not sure this has ever been pursued  further.  She did have a skin biopsy performed at that time.  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  She apparently has had a  positive ANA in the past, but was not felt to have systemic sclerosis.  Her ANA was a speckled panel positive 1:80.   I have gone with recommendations:  1. Repeat ANA, C3, C4 to exclude angioedema.  2. Check urine and serum for porphyrins.  3. Check C19-9 pancreatic tumor markers since she has a family history      of pancreatic cancer in a twin brother.  4. Trial of Align probiotic therapy.  5. I was going to place this patient on anticholinergic therapy, but      she has glaucoma and apparently cannot tolerate this class of      medications.     Vania Rea. Jarold Motto, MD, Caleen Essex, FAGA  Electronically Signed    DRP/MedQ  DD: 05/15/2007  DT: 05/15/2007  Job #: [id number]   cc:   Rosalyn Gess. Norins, MD  Sigmund I. Patsi Sears, M.D.  Gerri Spore B. Earlene Plater, M.D.

## 2011-01-26 NOTE — Assessment & Plan Note (Signed)
[facility]                                 ON-CALL NOTE   Shelly, [name]                         MRN:          [id number]  DATE:06/11/2007                            DOB:          May 18, 1941    She is a patient of Dr. Valera Castle.  Shelly [name] just saw Dr. Daleen Squibb  yesterday, September 30, in evaluation for hypertension.  She was placed  on Norvasc 5 mg daily with plans for a clean-catch urinalysis as well as  2D echocardiogram.  She took her first dose of Norvasc yesterday with  what she felt to be pretty good results with pressures in the 120 to  150s.  She took her second dose this morning about 10:30 and noted a  pressure of 150 systolic at dinner time, and then just took her blood  pressure and noted it to be 190 systolic.  She is calling asking what  she should do.   I recommended that she repeat her blood pressure to confirm that it is  truly 190, and if it is, she is advised to take an additional 5 mg of  Norvasc this evening.  I recommended that she follow up with her blood  pressure in the morning and then take her morning 5 mg dose as well.  I  then recommended that she should take a blood pressure tomorrow  afternoon and, if she is still running high, to call our office before  closure as it is likely she will need 10 mg of Norvasc.  She verbalized  understanding and will call back with any questions or concerns.     Shelly [name], ANP  Electronically Signed    CB/MedQ  DD: 06/11/2007  DT: 06/12/2007  Job #: [id number]

## 2011-02-20 ENCOUNTER — Other Ambulatory Visit: Payer: Medicare Other | Admitting: *Deleted

## 2011-03-05 ENCOUNTER — Other Ambulatory Visit: Payer: Medicare Other | Admitting: *Deleted

## 2011-04-19 ENCOUNTER — Other Ambulatory Visit: Payer: Medicare Other | Admitting: *Deleted

## 2011-09-18 ENCOUNTER — Other Ambulatory Visit: Payer: Self-pay | Admitting: Cardiology

## 2012-01-18 DIAGNOSIS — H2510 Age-related nuclear cataract, unspecified eye: Secondary | ICD-10-CM | POA: Insufficient documentation

## 2012-01-20 IMAGING — CR DG CHEST 2V
2 series · 2 of 2 positions shown · non-contrast
Comparison: Chest radiograph 04/05/2009

CLINICAL DATA: Cough, smoker

CHEST - 2 VIEW

[view not recorded (1 of 2)]
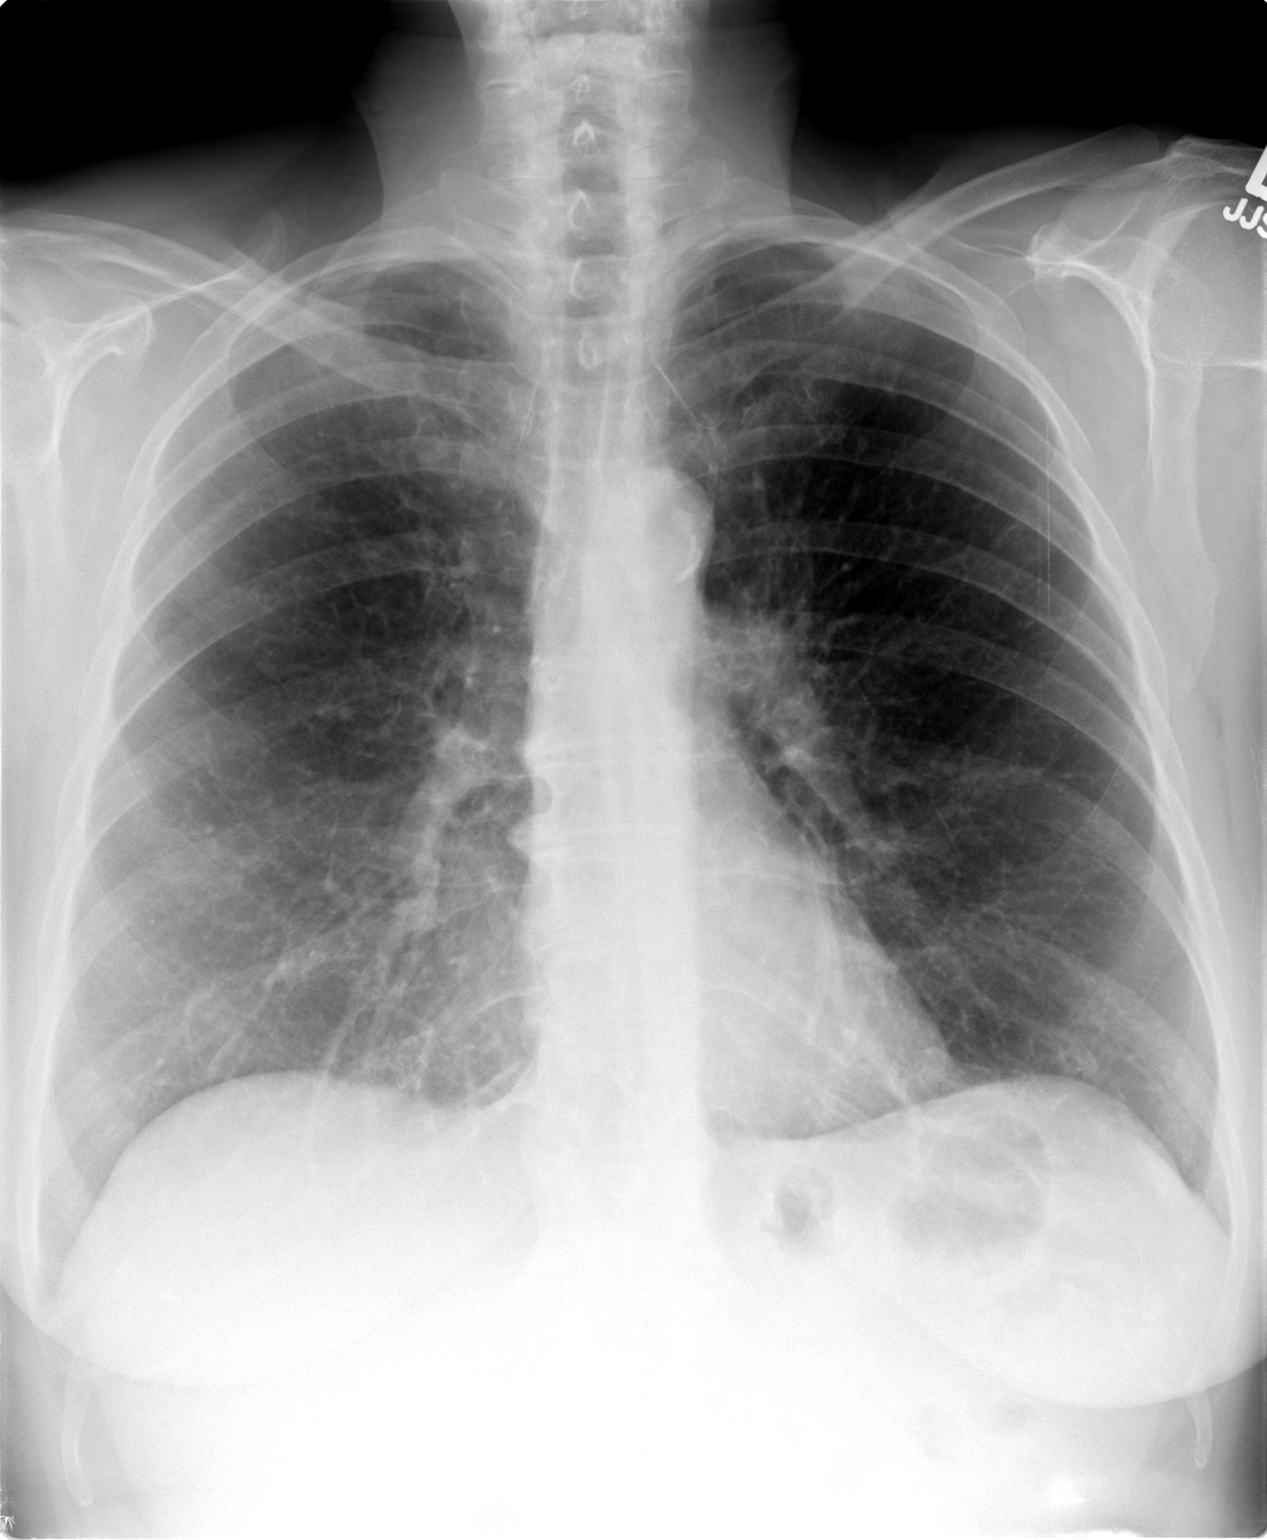

[view not recorded (2 of 2)]
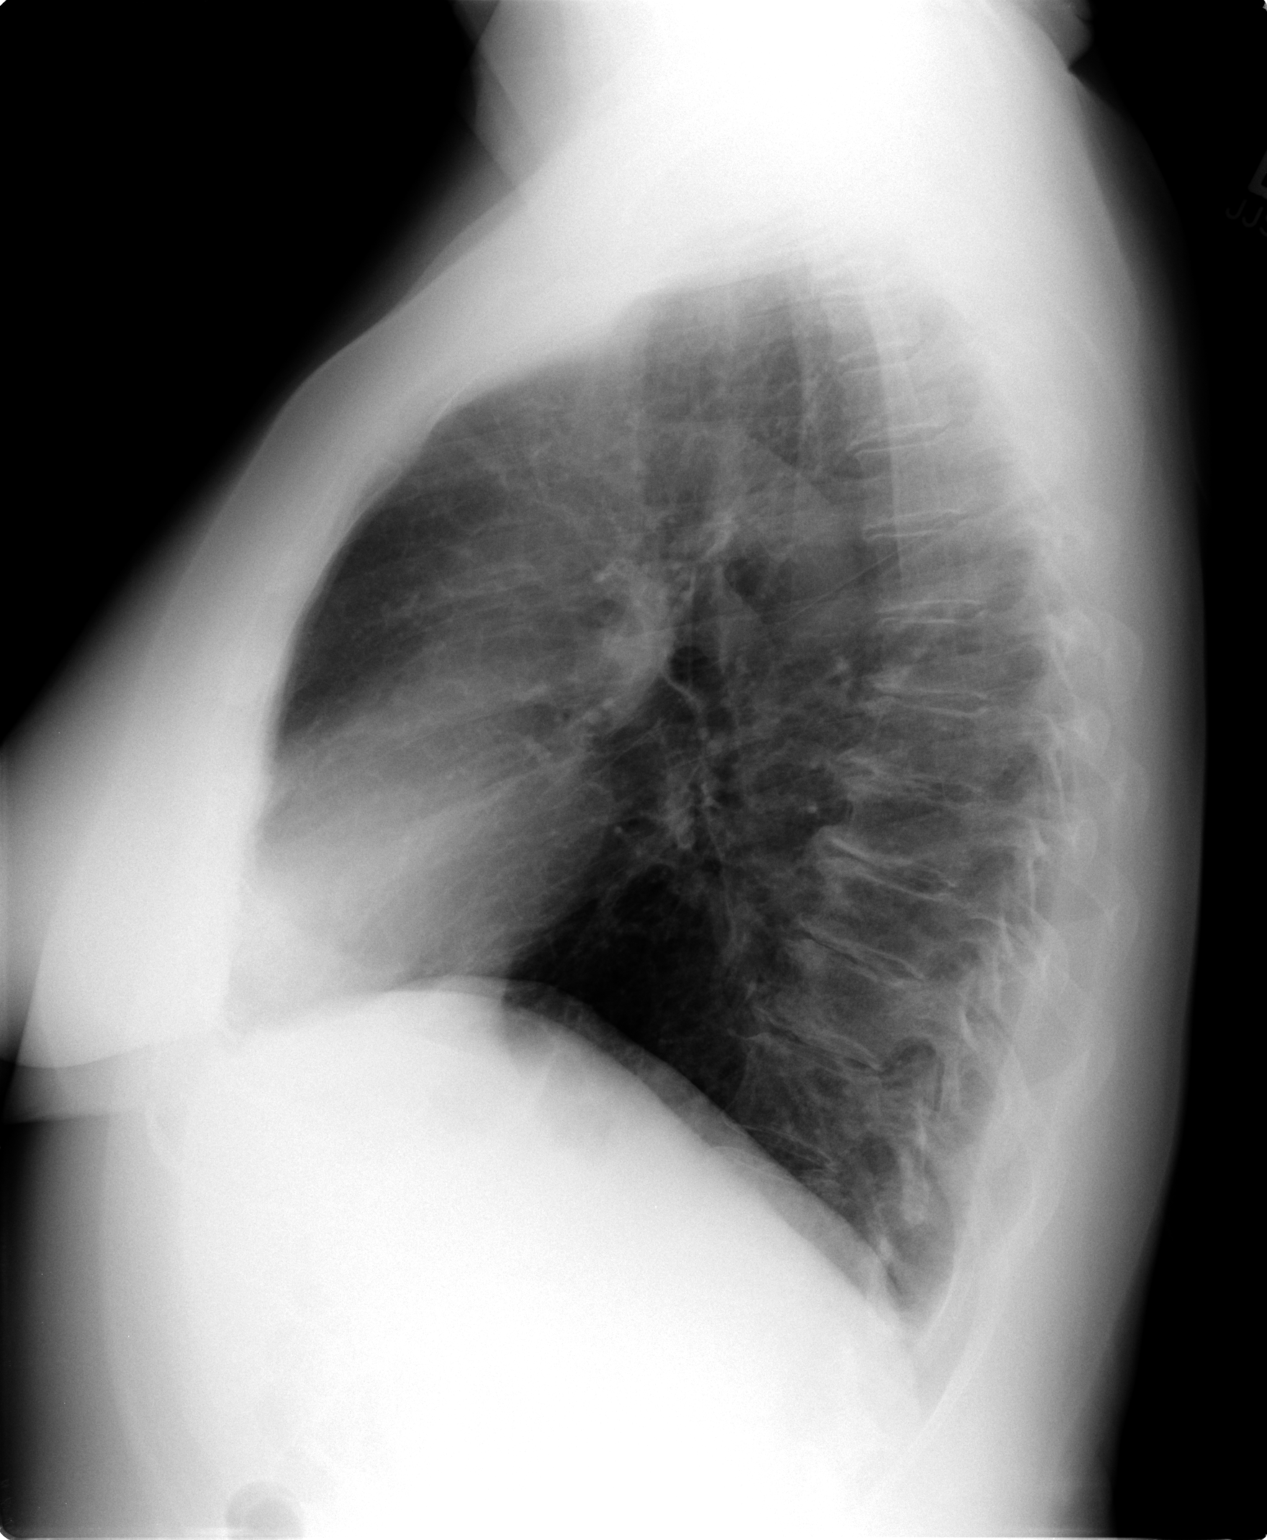

[2 of 2 positions shown; findings below may reference images not displayed]

FINDINGS: Normal mediastinum and cardiac silhouette.  Costophrenic
angles are clear.  No evidence effusion, infiltrate, or
pneumothorax.  There is bronchitic change centrally which is
similar to prior.  Lungs are slightly hyperinflated.  There is
generative change of the lower thoracic spine.
IMPRESSION: 1.  No acute cardiopulmonary process.
2.  Bronchitic change and hyperinflation.

## 2012-02-28 ENCOUNTER — Encounter: Payer: Self-pay | Admitting: Gastroenterology

## 2012-03-21 ENCOUNTER — Other Ambulatory Visit (INDEPENDENT_AMBULATORY_CARE_PROVIDER_SITE_OTHER): Payer: Medicare Other

## 2012-03-21 ENCOUNTER — Ambulatory Visit (INDEPENDENT_AMBULATORY_CARE_PROVIDER_SITE_OTHER): Payer: Medicare Other | Admitting: Gastroenterology

## 2012-03-21 ENCOUNTER — Encounter: Payer: Self-pay | Admitting: Gastroenterology

## 2012-03-21 VITALS — BP 104/50 | HR 68 | Ht 65.0 in | Wt 150.4 lb

## 2012-03-21 DIAGNOSIS — R109 Unspecified abdominal pain: Secondary | ICD-10-CM

## 2012-03-21 DIAGNOSIS — Z8719 Personal history of other diseases of the digestive system: Secondary | ICD-10-CM

## 2012-03-21 DIAGNOSIS — M549 Dorsalgia, unspecified: Secondary | ICD-10-CM

## 2012-03-21 DIAGNOSIS — Z79899 Other long term (current) drug therapy: Secondary | ICD-10-CM

## 2012-03-21 DIAGNOSIS — R198 Other specified symptoms and signs involving the digestive system and abdomen: Secondary | ICD-10-CM

## 2012-03-21 DIAGNOSIS — K5730 Diverticulosis of large intestine without perforation or abscess without bleeding: Secondary | ICD-10-CM

## 2012-03-21 DIAGNOSIS — N200 Calculus of kidney: Secondary | ICD-10-CM

## 2012-03-21 DIAGNOSIS — K4090 Unilateral inguinal hernia, without obstruction or gangrene, not specified as recurrent: Secondary | ICD-10-CM

## 2012-03-21 LAB — CBC WITH DIFFERENTIAL/PLATELET
Basophils Absolute: 0.1 10*3/uL (ref 0.0–0.1)
HCT: 40 % (ref 36.0–46.0)
Hemoglobin: 13.4 g/dL (ref 12.0–15.0)
Lymphs Abs: 3.1 10*3/uL (ref 0.7–4.0)
MCV: 88 fl (ref 78.0–100.0)
Monocytes Relative: 5.5 % (ref 3.0–12.0)
Neutro Abs: 7 10*3/uL (ref 1.4–7.7)
RDW: 13.7 % (ref 11.5–14.6)

## 2012-03-21 LAB — IBC PANEL
Iron: 92 ug/dL (ref 42–145)
Transferrin: 264.4 mg/dL (ref 212.0–360.0)

## 2012-03-21 LAB — BASIC METABOLIC PANEL
BUN: 21 mg/dL (ref 6–23)
CO2: 28 mEq/L (ref 19–32)
Calcium: 9.4 mg/dL (ref 8.4–10.5)
Creatinine, Ser: 0.8 mg/dL (ref 0.4–1.2)
Glucose, Bld: 89 mg/dL (ref 70–99)
Sodium: 140 mEq/L (ref 135–145)

## 2012-03-21 LAB — FOLATE: Folate: >24.8 ng/mL (ref >5.9)

## 2012-03-21 LAB — HEPATIC FUNCTION PANEL
Albumin: 3.8 g/dL (ref 3.5–5.2)
Alkaline Phosphatase: 83 U/L (ref 39–117)
Bilirubin, Direct: 0.1 mg/dL (ref 0.0–0.3)

## 2012-03-21 NOTE — Progress Notes (Signed)
This is a very nice 71 year old Caucasian female who sustained trauma to her back and rectum in the summer with resultant hematoma causing persistent pain. In the interim, she was treated empirically for" shingles" without any rash apparently by Dr. Patsi Sears in urology. Again, apparently urologic examination was unremarkable. We have requested CT scan for review. The patient continues with pain in her coccygeal area, right hip, and left ankle area. Recent gynecologic exam was apparently normal. She complains of a fullness in her left groin area while standing. Her bowels are fairly regular without melena or hematochezia. She denies anorexia, weight loss, upper GI or hepatobiliary complaints. She is status post appendectomy and cholecystectomy. Family history is noncontributory.  Current Medications, Allergies, Past Medical History, Past Surgical History, Family History and Social History were reviewed in [employer] record.  Pertinent Review of Systems Negative   Physical Exam: Healthy-appearing patient in no distress. Blood pressure 104 with 50, pulse 60 and regular, and weight 150 pounds with a BMI of 25.02. I cannot appreciate stigmata of chronic liver disease. Chest is clear, and she appear to be in a regular rhythm without murmurs gallops or rubs. There is no organomegaly, abdominal masses, and she has some tenderness over a palpable sigmoid colon the left lower quadrant area. There is a small inguinal hernia on observation while the patient stands. Inspection the rectum is unremarkable as is rectal exam. I cannot appreciate a rectocele, masses, tenderness, fluctuance, or impaction. Stool is guaiac-negative. Peripheral extremities are unremarkable with good pulses and no evidence of edema or phlebitis. Mental status is normal.    Assessment and Plan: Musculoskeletal low back pain probably associated with recent trauma when she fell down some steps. I see no evidence of  current hematoma formation, vascular or neurological impairment. She does have a small direct left inguinal hernia reviewed on previous CT scan. I placed her on a high fiber diet with daily Metamucil and liberal by mouth fluids for her diverticulosis and vague lower abdominal discomfort. CT scan requested for review. Last colonoscopy was in August of 2008. Screening labs today also ordered for review with followup in several weeks. She has minor acid reflux which is controlled by when necessary Zantac 150 mg. She also is had a previous negative endoscopic exam. Encounter Diagnoses  Name Primary?  . Abdominal  pain, other specified site Yes  . Other symptoms involving digestive system

## 2012-03-21 NOTE — Patient Instructions (Addendum)
Your physician has requested that you go to the basement for the following lab work before leaving today: Start taking over the counter Metamucil once daily.   High Fiber Diet A high fiber diet changes your normal diet to include more whole grains, legumes, fruits, and vegetables. Changes in the diet involve replacing refined carbohydrates with unrefined foods. The calorie level of the diet is essentially unchanged. The Dietary Reference Intake (recommended amount) for adult males is 38 g per day. For adult females, it is 25 g per day. Pregnant and lactating women should consume 28 g of fiber per day. Fiber is the intact part of a plant that is not broken down during digestion. Functional fiber is fiber that has been isolated from the plant to provide a beneficial effect in the body. PURPOSE  Increase stool bulk.   Ease and regulate bowel movements.   Lower cholesterol.  INDICATIONS THAT YOU NEED MORE FIBER  Constipation and hemorrhoids.   Uncomplicated diverticulosis (intestine condition) and irritable bowel syndrome.   Weight management.   As a protective measure against hardening of the arteries (atherosclerosis), diabetes, and cancer.  NOTE OF CAUTION If you have a digestive or bowel problem, ask your caregiver for advice before adding high fiber foods to your diet. Some of the following medical problems are such that a high fiber diet should not be used without consulting your caregiver:  Acute diverticulitis (intestine infection).   Partial small bowel obstructions.   Complicated diverticular disease involving bleeding, rupture (perforation), or abscess (boil, furuncle).   Presence of autonomic neuropathy (nerve damage) or gastric paresis (stomach cannot empty itself).  GUIDELINES FOR INCREASING FIBER  Start adding fiber to the diet slowly. A gradual increase of about 5 more grams (2 slices of whole-wheat bread, 2 servings of most fruits or vegetables, or 1 bowl of high fiber  cereal) per day is best. Too rapid an increase in fiber may result in constipation, flatulence, and bloating.   Drink enough water and fluids to keep your urine clear or pale yellow. Water, juice, or caffeine-free drinks are recommended. Not drinking enough fluid may cause constipation.   Eat a variety of high fiber foods rather than one type of fiber.   Try to increase your intake of fiber through using high fiber foods rather than fiber pills or supplements that contain small amounts of fiber.   The goal is to change the types of food eaten. Do not supplement your present diet with high fiber foods, but replace foods in your present diet.  INCLUDE A VARIETY OF FIBER SOURCES  Replace refined and processed grains with whole grains, canned fruits with fresh fruits, and incorporate other fiber sources. White rice, white breads, and most bakery goods contain little or no fiber.   Brown whole-grain rice, buckwheat oats, and many fruits and vegetables are all good sources of fiber. These include: broccoli, Brussels sprouts, cabbage, cauliflower, beets, sweet potatoes, white potatoes (skin on), carrots, tomatoes, eggplant, squash, berries, fresh fruits, and dried fruits.   Cereals appear to be the richest source of fiber. Cereal fiber is found in whole grains and bran. Bran is the fiber-rich outer coat of cereal grain, which is largely removed in refining. In whole-grain cereals, the bran remains. In breakfast cereals, the largest amount of fiber is found in those with "bran" in their names. The fiber content is sometimes indicated on the label.   You may need to include additional fruits and vegetables each day.   In baking,  for 1 cup white flour, you may use the following substitutions:   1 cup whole-wheat flour minus 2 tbs.    cup white flour plus  cup whole-wheat flour.  Document Released: 08/27/2005 Document Revised: 08/16/2011 Document Reviewed: 07/05/2009 [facility] Patient Information  2012 [place], [place].   cc: Illene Regulus, MD

## 2012-03-28 ENCOUNTER — Other Ambulatory Visit: Payer: Self-pay | Admitting: Cardiology

## 2012-04-22 ENCOUNTER — Encounter (INDEPENDENT_AMBULATORY_CARE_PROVIDER_SITE_OTHER): Payer: Self-pay | Admitting: Surgery

## 2012-05-14 ENCOUNTER — Encounter (INDEPENDENT_AMBULATORY_CARE_PROVIDER_SITE_OTHER): Payer: Self-pay | Admitting: Surgery

## 2012-05-14 ENCOUNTER — Ambulatory Visit (INDEPENDENT_AMBULATORY_CARE_PROVIDER_SITE_OTHER): Payer: Medicare Other | Admitting: Surgery

## 2012-05-14 VITALS — BP 122/83 | HR 84 | Temp 97.6°F | Resp 18 | Ht 65.0 in | Wt 153.2 lb

## 2012-05-14 DIAGNOSIS — K4090 Unilateral inguinal hernia, without obstruction or gangrene, not specified as recurrent: Secondary | ICD-10-CM | POA: Insufficient documentation

## 2012-05-14 NOTE — Progress Notes (Signed)
Patient ID: Shelly [name], female   DOB: [birth year], 71 y.o.   MRN: [id number]  Chief Complaint  Patient presents with  . Hernia    HPI Shelly [name] is a 71 y.o. female.  This is a pleasant female referred by Dr. Patsi Sears for evaluation of a left groin bulge. She notices approximate 4 weeks ago. She is now having pain in the left groin. She reports that it easily reduces. She has had no nausea or vomiting or obstructive symptoms. The pain is mild and refers across the lower abdomen. She is moving her bowels well and having no difficulty with her urine. HPI  Past Medical History  Diagnosis Date  . Hemorrhoids   . IBS (irritable bowel syndrome)   . Diverticulosis 04/21/2007  . GERD (gastroesophageal reflux disease)   . History of angioedema   . History of kidney stones   . HTN (hypertension)     stress  . Hiatal hernia 04/21/2007  . Gastritis 04/21/2007  . HLD (hyperlipidemia)   . Arthritis   . Glaucoma   . Anxiety   . History of pneumonia     Past Surgical History  Procedure Date  . Kidney stone surgery   . Cholecystectomy   . Appendectomy     age 42  . Bladder repair     Family History  Problem Relation Age of Onset  . Pancreatic cancer Mother   . Pancreatic cancer Brother   . Diabetes Brother   . Colon cancer Neg Hx   . COPD Father   . Heart attack Brother     Social History History  Substance Use Topics  . Smoking status: Former Smoker -- 0.5 packs/day    Types: Cigarettes    Quit date: 04/13/2012  . Smokeless tobacco: Never Used  . Alcohol Use: No    Allergies  Allergen Reactions  . Aspirin   . Iohexol      Code: HIVES, Desc: PT had IVP in 1992- developed severe hives several hours post injection., Onset Date: [id number]   . Penicillins   . Shellfish Allergy     Current Outpatient Prescriptions  Medication Sig Dispense Refill  . amLODipine (NORVASC) 10 MG tablet TAKE 1 TABLET (10 MG TOTAL) BY MOUTH DAILY.  90 tablet  1  . BOOSTRIX 5-2.5-18.5  injection       . furosemide (LASIX) 20 MG tablet Take 1/4 tablet by mouth every 6 hours as needed for peripheral edema      . latanoprost (XALATAN) 0.005 % ophthalmic solution Place 1 drop into both eyes at bedtime.       . ranitidine (ZANTAC) 150 MG capsule Take 150 mg by mouth as needed.      . timolol (TIMOPTIC) 0.5 % ophthalmic solution Place 1 drop into both eyes daily.         Review of Systems Review of Systems  Constitutional: Negative for fever, chills and unexpected weight change.  HENT: Negative for hearing loss, congestion, sore throat, trouble swallowing and voice change.   Eyes: Negative for visual disturbance.  Respiratory: Negative for cough and wheezing.   Cardiovascular: Negative for chest pain, palpitations and leg swelling.  Gastrointestinal: Negative for nausea, vomiting, abdominal pain, diarrhea, constipation, blood in stool, abdominal distention and anal bleeding.  Genitourinary: Negative for hematuria, vaginal bleeding and difficulty urinating.  Musculoskeletal: Negative for arthralgias.  Skin: Negative for rash and wound.  Neurological: Negative for seizures, syncope and headaches.  Hematological: Negative for adenopathy. Does not bruise/bleed easily.  Psychiatric/Behavioral: Negative for confusion.    Blood pressure 122/83, pulse 84, temperature 97.6 F (36.4 C), temperature source Temporal, resp. rate 18, height 5\' 5"  (1.651 m), weight 153 lb 3.2 oz (69.491 kg).  Physical Exam Physical Exam  Constitutional: She is oriented to person, place, and time. She appears well-developed and well-nourished. No distress.  HENT:  Head: Normocephalic and atraumatic.  Right Ear: External ear normal.  Left Ear: External ear normal.  Nose: Nose normal.  Mouth/Throat: Oropharynx is clear and moist. No oropharyngeal exudate.  Eyes: Conjunctivae are normal. Pupils are equal, round, and reactive to light. Right eye exhibits no discharge. Left eye exhibits no discharge. No  scleral icterus.  Neck: Normal range of motion. Neck supple. No tracheal deviation present. No thyromegaly present.  Cardiovascular: Normal rate, regular rhythm, normal heart sounds and intact distal pulses.   No murmur heard. Pulmonary/Chest: Effort normal and breath sounds normal. No respiratory distress. She has no wheezes. She has no rales.  Abdominal: Soft. Bowel sounds are normal. She exhibits no distension. There is no tenderness. There is no rebound.       There is an easily reducible left inguinal hernia. There is no evidence of right inguinal hernia.  Musculoskeletal: Normal range of motion. She exhibits no edema and no tenderness.  Lymphadenopathy:    She has no cervical adenopathy.  Neurological: She is alert and oriented to person, place, and time.  Skin: Skin is warm and dry. No rash noted. She is not diaphoretic. No erythema.  Psychiatric: Her behavior is normal.    Data Reviewed   Assessment    Left inguinal hernia    Plan    As she is symptomatic, repair with mesh was recommended. I've discussed this with her in detail. I will plan to do this open and limit her anesthesia. I discussed the risks of surgery which includes but is not limited to bleeding, infection, nerve entrapment, chronic pain, recurrence, etc. She understands and wishes to proceed. Likelihood success is good       Copeland Lapier A 05/14/2012, 2:33 PM

## 2012-05-27 ENCOUNTER — Encounter (HOSPITAL_COMMUNITY): Payer: Self-pay | Admitting: Pharmacy Technician

## 2012-06-02 ENCOUNTER — Encounter (HOSPITAL_COMMUNITY)
Admission: RE | Admit: 2012-06-02 | Discharge: 2012-06-02 | Disposition: A | Discharge status: Home / Self Care | Payer: Medicare Other | Source: Ambulatory Visit | Attending: Surgery | Admitting: Surgery

## 2012-06-02 ENCOUNTER — Encounter (HOSPITAL_COMMUNITY): Payer: Self-pay

## 2012-06-02 ENCOUNTER — Ambulatory Visit (HOSPITAL_COMMUNITY)
Admission: RE | Admit: 2012-06-02 | Discharge: 2012-06-02 | Disposition: A | Discharge status: Home / Self Care | Payer: Medicare Other | Source: Ambulatory Visit | Attending: Surgery | Admitting: Surgery

## 2012-06-02 DIAGNOSIS — K4090 Unilateral inguinal hernia, without obstruction or gangrene, not specified as recurrent: Secondary | ICD-10-CM | POA: Insufficient documentation

## 2012-06-02 DIAGNOSIS — Z01812 Encounter for preprocedural laboratory examination: Secondary | ICD-10-CM | POA: Insufficient documentation

## 2012-06-02 DIAGNOSIS — Z01810 Encounter for preprocedural cardiovascular examination: Secondary | ICD-10-CM | POA: Insufficient documentation

## 2012-06-02 LAB — BASIC METABOLIC PANEL
Calcium: 9.4 mg/dL (ref 8.4–10.5)
GFR calc non Af Amer: 83 mL/min — ABNORMAL LOW (ref >90)
Sodium: 138 mEq/L (ref 135–145)

## 2012-06-02 LAB — SURGICAL PCR SCREEN: MRSA, PCR: NEGATIVE

## 2012-06-02 LAB — CBC
MCH: 28.9 pg (ref 26.0–34.0)
MCHC: 33 g/dL (ref 30.0–36.0)
Platelets: 170 10*3/uL (ref 150–400)
RDW: 13.6 % (ref 11.5–15.5)

## 2012-06-02 NOTE — Patient Instructions (Addendum)
20 Shelly [name]  06/02/2012   Your procedure is scheduled on: 06/06/12  Friday   Surgery  1200-1300   Report to [facility] at   0930    AM.  Call this number if you have problems the morning of surgery: [phone]     Or PST   [id number]  [facility]   Remember:   Do not eat food or drink any fluids :After Midnight. Thursday  NIGHT       Take these medicines the morning of surgery with A SIP OF WATER: ZANTAC, ALODIIPINE   Do not wear jewelry, make-up or nail polish.  Do not wear lotions, powders, or perfumes. You may wear deodorant.  Do not shave 48 hours prior to surgery.  Do not bring valuables to the hospital.  Contacts, dentures or bridgework may not be worn into surgery.  Leave suitcase in the car. After surgery it may be brought to your room.  For patients admitted to the hospital, checkout time is 11:00 AM the day of discharge.   Patients discharged the day of surgery will not be allowed to drive home.  Name and phone number of your driver:   HOWARD                                                                   Special Instructions: CHG Shower Use Special Wash:     SEE ADDITIONAL  INSTRUCTIONS  REGULAR SOAP FACE AND PRIVATES              LADIES- NO SHAVING 48 HOURS BEFORE USING BETASEPT SOAP.              Please read over the following fact sheets that you were given: MRSA Information

## 2012-06-02 NOTE — Pre-Procedure Instructions (Signed)
Per Britta Mccreedy at [facility]- Dr Magnus Ivan is aware of chest x ray report

## 2012-06-05 NOTE — H&P (Signed)
Patient ID: Shelly [name], female DOB: 10-09-1940, 71 y.o. MRN: [id number]  Chief Complaint   Patient presents with   .  Hernia    HPI  Shelly [name] is a 71 y.o. female. This is a pleasant female referred by Dr. Patsi Sears for evaluation of a left groin bulge. She notices approximate 4 weeks ago. She is now having pain in the left groin. She reports that it easily reduces. She has had no nausea or vomiting or obstructive symptoms. The pain is mild and refers across the lower abdomen. She is moving her bowels well and having no difficulty with her urine.  HPI  Past Medical History   Diagnosis  Date   .  Hemorrhoids    .  IBS (irritable bowel syndrome)    .  Diverticulosis  04/21/2007   .  GERD (gastroesophageal reflux disease)    .  History of angioedema    .  History of kidney stones    .  HTN (hypertension)      stress   .  Hiatal hernia  04/21/2007   .  Gastritis  04/21/2007   .  HLD (hyperlipidemia)    .  Arthritis    .  Glaucoma    .  Anxiety    .  History of pneumonia     Past Surgical History   Procedure  Date   .  Kidney stone surgery    .  Cholecystectomy    .  Appendectomy      age 85   .  Bladder repair     Family History   Problem  Relation  Age of Onset   .  Pancreatic cancer  Mother    .  Pancreatic cancer  Brother    .  Diabetes  Brother    .  Colon cancer  Neg Hx    .  COPD  Father    .  Heart attack  Brother     Social History  History   Substance Use Topics   .  Smoking status:  Former Smoker -- 0.5 packs/day     Types:  Cigarettes     Quit date:  04/13/2012   .  Smokeless tobacco:  Never Used   .  Alcohol Use:  No    Allergies   Allergen  Reactions   .  Aspirin    .  Iohexol      Code: HIVES, Desc: PT had IVP in 1992- developed severe hives several hours post injection., Onset Date: [id number]   .  Penicillins    .  Shellfish Allergy     Current Outpatient Prescriptions   Medication  Sig  Dispense  Refill   .  amLODipine (NORVASC) 10 MG  tablet  TAKE 1 TABLET (10 MG TOTAL) BY MOUTH DAILY.  90 tablet  1   .  BOOSTRIX 5-2.5-18.5 injection      .  furosemide (LASIX) 20 MG tablet  Take 1/4 tablet by mouth every 6 hours as needed for peripheral edema     .  latanoprost (XALATAN) 0.005 % ophthalmic solution  Place 1 drop into both eyes at bedtime.     .  ranitidine (ZANTAC) 150 MG capsule  Take 150 mg by mouth as needed.     .  timolol (TIMOPTIC) 0.5 % ophthalmic solution  Place 1 drop into both eyes daily.      Review of Systems  Review of Systems  Constitutional: Negative for fever, chills  and unexpected weight change.  HENT: Negative for hearing loss, congestion, sore throat, trouble swallowing and voice change.  Eyes: Negative for visual disturbance.  Respiratory: Negative for cough and wheezing.  Cardiovascular: Negative for chest pain, palpitations and leg swelling.  Gastrointestinal: Negative for nausea, vomiting, abdominal pain, diarrhea, constipation, blood in stool, abdominal distention and anal bleeding.  Genitourinary: Negative for hematuria, vaginal bleeding and difficulty urinating.  Musculoskeletal: Negative for arthralgias.  Skin: Negative for rash and wound.  Neurological: Negative for seizures, syncope and headaches.  Hematological: Negative for adenopathy. Does not bruise/bleed easily.  Psychiatric/Behavioral: Negative for confusion.   Blood pressure 122/83, pulse 84, temperature 97.6 F (36.4 C), temperature source Temporal, resp. rate 18, height 5\' 5"  (1.651 m), weight 153 lb 3.2 oz (69.491 kg).  Physical Exam  Physical Exam  Constitutional: She is oriented to person, place, and time. She appears well-developed and well-nourished. No distress.  HENT:  Head: Normocephalic and atraumatic.  Right Ear: External ear normal.  Left Ear: External ear normal.  Nose: Nose normal.  Mouth/Throat: Oropharynx is clear and moist. No oropharyngeal exudate.  Eyes: Conjunctivae are normal. Pupils are equal, round, and  reactive to light. Right eye exhibits no discharge. Left eye exhibits no discharge. No scleral icterus.  Neck: Normal range of motion. Neck supple. No tracheal deviation present. No thyromegaly present.  Cardiovascular: Normal rate, regular rhythm, normal heart sounds and intact distal pulses.  No murmur heard.  Pulmonary/Chest: Effort normal and breath sounds normal. No respiratory distress. She has no wheezes. She has no rales.  Abdominal: Soft. Bowel sounds are normal. She exhibits no distension. There is no tenderness. There is no rebound.  There is an easily reducible left inguinal hernia. There is no evidence of right inguinal hernia.  Musculoskeletal: Normal range of motion. She exhibits no edema and no tenderness.  Lymphadenopathy:  She has no cervical adenopathy.  Neurological: She is alert and oriented to person, place, and time.  Skin: Skin is warm and dry. No rash noted. She is not diaphoretic. No erythema.  Psychiatric: Her behavior is normal.   Data Reviewed  Assessment   Left inguinal hernia   Plan   As she is symptomatic, repair with mesh was recommended. I've discussed this with her in detail. I will plan to do this open and limit her anesthesia. I discussed the risks of surgery which includes but is not limited to bleeding, infection, nerve entrapment, chronic pain, recurrence, etc. She understands and wishes to proceed. Likelihood success is good   Shelly [name] A

## 2012-06-06 ENCOUNTER — Ambulatory Visit (HOSPITAL_COMMUNITY): Payer: Medicare Other | Admitting: Anesthesiology

## 2012-06-06 ENCOUNTER — Ambulatory Visit (HOSPITAL_COMMUNITY)
Admission: RE | Admit: 2012-06-06 | Discharge: 2012-06-06 | Disposition: A | Discharge status: Home / Self Care | Payer: Medicare Other | Source: Ambulatory Visit | Attending: Surgery | Admitting: Surgery

## 2012-06-06 ENCOUNTER — Encounter (HOSPITAL_COMMUNITY): Payer: Self-pay | Admitting: Anesthesiology

## 2012-06-06 ENCOUNTER — Encounter (HOSPITAL_COMMUNITY): Payer: Self-pay | Admitting: *Deleted

## 2012-06-06 ENCOUNTER — Encounter (HOSPITAL_COMMUNITY)
Admission: RE | Disposition: A | Discharge status: Home / Self Care | Payer: Self-pay | Source: Ambulatory Visit | Attending: Surgery

## 2012-06-06 DIAGNOSIS — K4090 Unilateral inguinal hernia, without obstruction or gangrene, not specified as recurrent: Secondary | ICD-10-CM

## 2012-06-06 DIAGNOSIS — K219 Gastro-esophageal reflux disease without esophagitis: Secondary | ICD-10-CM | POA: Insufficient documentation

## 2012-06-06 DIAGNOSIS — I10 Essential (primary) hypertension: Secondary | ICD-10-CM | POA: Insufficient documentation

## 2012-06-06 HISTORY — PX: INGUINAL HERNIA REPAIR: SHX194

## 2012-06-06 SURGERY — REPAIR, HERNIA, INGUINAL, ADULT
Anesthesia: General | Laterality: Left | Wound class: Clean

## 2012-06-06 MED ORDER — ACETAMINOPHEN 10 MG/ML IV SOLN
INTRAVENOUS | Status: AC
  Filled 2012-06-06: qty 100

## 2012-06-06 MED ORDER — FENTANYL CITRATE 0.05 MG/ML IJ SOLN
INTRAMUSCULAR | Status: DC | PRN
  Administered 2012-06-06: 50 ug via INTRAVENOUS

## 2012-06-06 MED ORDER — OXYCODONE HCL 5 MG PO TABS
5.0000 mg | ORAL_TABLET | ORAL | Status: DC | PRN
  Administered 2012-06-06: 5 mg via ORAL
  Filled 2012-06-06: qty 1

## 2012-06-06 MED ORDER — CIPROFLOXACIN IN D5W 400 MG/200ML IV SOLN
INTRAVENOUS | Status: AC
  Filled 2012-06-06: qty 200

## 2012-06-06 MED ORDER — ACETAMINOPHEN 325 MG PO TABS: 650.0000 mg | ORAL_TABLET | ORAL | Status: DC | PRN

## 2012-06-06 MED ORDER — ACETAMINOPHEN 10 MG/ML IV SOLN
INTRAVENOUS | Status: DC | PRN
  Administered 2012-06-06: 1000 mg via INTRAVENOUS

## 2012-06-06 MED ORDER — PROMETHAZINE HCL 25 MG/ML IJ SOLN: 6.2500 mg | INTRAMUSCULAR | Status: DC | PRN

## 2012-06-06 MED ORDER — BUPIVACAINE HCL (PF) 0.5 % IJ SOLN
INTRAMUSCULAR | Status: DC | PRN
  Administered 2012-06-06: 20 mL

## 2012-06-06 MED ORDER — SODIUM CHLORIDE 0.9 % IV SOLN: 250.0000 mL | INTRAVENOUS | Status: DC | PRN

## 2012-06-06 MED ORDER — HYDROCODONE-ACETAMINOPHEN 5-325 MG PO TABS: 1.0000 | ORAL_TABLET | Freq: Four times a day (QID) | ORAL | Status: DC | PRN

## 2012-06-06 MED ORDER — ACETAMINOPHEN 650 MG RE SUPP
650.0000 mg | RECTAL | Status: DC | PRN
  Filled 2012-06-06: qty 1

## 2012-06-06 MED ORDER — 0.9 % SODIUM CHLORIDE (POUR BTL) OPTIME
TOPICAL | Status: DC | PRN
  Administered 2012-06-06: 1000 mL

## 2012-06-06 MED ORDER — BUPIVACAINE HCL (PF) 0.5 % IJ SOLN
INTRAMUSCULAR | Status: AC
  Filled 2012-06-06: qty 30

## 2012-06-06 MED ORDER — LACTATED RINGERS IV SOLN
INTRAVENOUS | Status: DC
  Administered 2012-06-06: 1000 mL via INTRAVENOUS

## 2012-06-06 MED ORDER — PROPOFOL 10 MG/ML IV BOLUS
INTRAVENOUS | Status: DC | PRN
  Administered 2012-06-06: 200 mg via INTRAVENOUS

## 2012-06-06 MED ORDER — MORPHINE SULFATE 10 MG/ML IJ SOLN: 1.0000 mg | INTRAMUSCULAR | Status: DC | PRN

## 2012-06-06 MED ORDER — SODIUM CHLORIDE 0.9 % IJ SOLN: 3.0000 mL | INTRAMUSCULAR | Status: DC | PRN

## 2012-06-06 MED ORDER — CIPROFLOXACIN IN D5W 400 MG/200ML IV SOLN
400.0000 mg | INTRAVENOUS | Status: AC
  Administered 2012-06-06: 400 mg via INTRAVENOUS

## 2012-06-06 MED ORDER — ONDANSETRON HCL 4 MG/2ML IJ SOLN: 4.0000 mg | Freq: Four times a day (QID) | INTRAMUSCULAR | Status: DC | PRN

## 2012-06-06 MED ORDER — FENTANYL CITRATE 0.05 MG/ML IJ SOLN
25.0000 ug | INTRAMUSCULAR | Status: DC | PRN
  Administered 2012-06-06: 12.5 ug via INTRAVENOUS

## 2012-06-06 MED ORDER — FENTANYL CITRATE 0.05 MG/ML IJ SOLN
INTRAMUSCULAR | Status: AC
  Filled 2012-06-06: qty 2

## 2012-06-06 MED ORDER — SODIUM CHLORIDE 0.9 % IJ SOLN: 3.0000 mL | Freq: Two times a day (BID) | INTRAMUSCULAR | Status: DC

## 2012-06-06 SURGICAL SUPPLY — 45 items
ADH SKN CLS APL DERMABOND .7 (GAUZE/BANDAGES/DRESSINGS)
BLADE HEX COATED 2.75 (ELECTRODE) ×2 IMPLANT
BLADE SURG 15 STRL LF DISP TIS (BLADE) ×1 IMPLANT
BLADE SURG 15 STRL SS (BLADE) ×2
BLADE SURG SZ10 CARB STEEL (BLADE) ×1 IMPLANT
CANISTER SUCTION 2500CC (MISCELLANEOUS) ×1 IMPLANT
CHLORAPREP W/TINT 26ML (MISCELLANEOUS) ×2 IMPLANT
CLOTH BEACON ORANGE TIMEOUT ST (SAFETY) ×2 IMPLANT
DECANTER SPIKE VIAL GLASS SM (MISCELLANEOUS) IMPLANT
DERMABOND ADVANCED (GAUZE/BANDAGES/DRESSINGS)
DERMABOND ADVANCED .7 DNX12 (GAUZE/BANDAGES/DRESSINGS) IMPLANT
DRAIN PENROSE 18X1/2 LTX STRL (DRAIN) ×1 IMPLANT
DRAPE LAPAROTOMY TRNSV 102X78 (DRAPE) ×2 IMPLANT
DRSG TEGADERM 4X4.75 (GAUZE/BANDAGES/DRESSINGS) ×1 IMPLANT
ELECT REM PT RETURN 9FT ADLT (ELECTROSURGICAL) ×2
ELECTRODE REM PT RTRN 9FT ADLT (ELECTROSURGICAL) ×1 IMPLANT
GAUZE SPONGE 4X4 16PLY XRAY LF (GAUZE/BANDAGES/DRESSINGS) IMPLANT
GLOVE SURG SIGNA 7.5 PF LTX (GLOVE) ×3 IMPLANT
GOWN STRL NON-REIN LRG LVL3 (GOWN DISPOSABLE) ×2 IMPLANT
GOWN STRL REIN XL XLG (GOWN DISPOSABLE) ×3 IMPLANT
KIT BASIN OR (CUSTOM PROCEDURE TRAY) ×2 IMPLANT
MESH PARIETEX PROGRIP LEFT (Mesh General) ×1 IMPLANT
NDL HYPO 25X1 1.5 SAFETY (NEEDLE) ×1 IMPLANT
NEEDLE HYPO 22GX1.5 SAFETY (NEEDLE) IMPLANT
NEEDLE HYPO 25X1 1.5 SAFETY (NEEDLE) ×2 IMPLANT
NS IRRIG 1000ML POUR BTL (IV SOLUTION) ×2 IMPLANT
PACK BASIC VI WITH GOWN DISP (CUSTOM PROCEDURE TRAY) ×2 IMPLANT
PENCIL BUTTON HOLSTER BLD 10FT (ELECTRODE) ×2 IMPLANT
SPONGE GAUZE 4X4 12PLY (GAUZE/BANDAGES/DRESSINGS) IMPLANT
SPONGE LAP 4X18 X RAY DECT (DISPOSABLE) ×2 IMPLANT
STRIP CLOSURE SKIN 1/2X4 (GAUZE/BANDAGES/DRESSINGS) ×1 IMPLANT
SUT MNCRL AB 4-0 PS2 18 (SUTURE) ×2 IMPLANT
SUT SILK 2 0 SH (SUTURE) ×2 IMPLANT
SUT SILK 3 0 (SUTURE) ×2
SUT SILK 3-0 18XBRD TIE 12 (SUTURE) IMPLANT
SUT VIC AB 2-0 CT1 27 (SUTURE) ×4
SUT VIC AB 2-0 CT1 TAPERPNT 27 (SUTURE) IMPLANT
SUT VIC AB 3-0 54XBRD REEL (SUTURE) IMPLANT
SUT VIC AB 3-0 BRD 54 (SUTURE)
SUT VIC AB 3-0 SH 27 (SUTURE) ×2
SUT VIC AB 3-0 SH 27XBRD (SUTURE) IMPLANT
SYR BULB IRRIGATION 50ML (SYRINGE) IMPLANT
SYR CONTROL 10ML LL (SYRINGE) ×2 IMPLANT
TOWEL OR 17X26 10 PK STRL BLUE (TOWEL DISPOSABLE) ×2 IMPLANT
YANKAUER SUCT BULB TIP 10FT TU (MISCELLANEOUS) ×1 IMPLANT

## 2012-06-06 NOTE — Progress Notes (Signed)
Pt has on light to moderate amount of make-up  She prefers to leave it on

## 2012-06-06 NOTE — Op Note (Signed)
HERNIA REPAIR INGUINAL ADULT, INSERTION OF MESH  Procedure Note  Anwyn Pila 06/06/2012   Pre-op Diagnosis: Left inguinal hernia     Post-op Diagnosis: same  Procedure(s): LEFT INGUINAL HERNIA REPAIR WITH MESH (PARIETEX PROGRIP)  Surgeon(s): Shelly Rubenstein, MD  Anesthesia: General  Staff:  Laureen Abrahams, RN - Circulator Guadelupe Sabin, CST - Scrub Person Roosvelt Harps, RN - Circulator  Estimated Blood Loss: Minimal               Procedure: The patient was brought to the operating room and identified as the correct patient. She was placed supine on the operating room table and general anesthesia was induced. Her left lower quadrant and groin were then prepped and draped in the usual sterile fashion. Using half percent Marcaine, I performed an ilioinguinal nerve block. I also anesthetized the skin with a marking. I then made a longitudinal incision in the left groin with a scalpel. I took this down through Scarpa's fascia with the electrocautery. The external oblique fascia was then opened to the internal and external rings. The patient had a moderate-sized indirect inguinal hernia sac which I dissected free from the surrounding tissue. I did reduce all contents back into the abdominal cavity and tied off the base of the sac with a silk suture. I then excised the redundant sac. I then reinforced the internal ring with a figure-of-eight 2-0 Vicryl suture. The mesh was brought onto the field.  I placed it as an onlay on the groin. I then secured it to the pubic tubercle with a suture. It covered the inguinal floor appeared to be achieved. I then closed the external oblique fascia over the top of this with 0 Vicryl suture. I anesthetized the wound with Marcaine. I closed Scarpa's fascia with interrupted 3-0 Vicryl sutures and closed the skin with a running 4-0 Monocryl. Steri-Strips, gauze, and Tegaderm were then applied. The patient tolerated the procedure well. All the  counts were correct at the end of the procedure. The patient was then extubated in the operating room and taken in a stable condition to the recovery room.          Krystal Teachey A   Date: 06/06/2012  Time: 11:36 AM

## 2012-06-06 NOTE — Anesthesia Postprocedure Evaluation (Signed)
  Anesthesia Post-op Note  Patient: Shelly [name]  Procedure(s) Performed: Procedure(s) (LRB): HERNIA REPAIR INGUINAL ADULT (Left) INSERTION OF MESH (Left)  Patient Location: PACU  Anesthesia Type: General  Level of Consciousness: awake and alert   Airway and Oxygen Therapy: Patient Spontanous Breathing  Post-op Pain: mild  Post-op Assessment: Post-op Vital signs reviewed, Patient's Cardiovascular Status Stable, Respiratory Function Stable, Patent Airway and No signs of Nausea or vomiting  Post-op Vital Signs: stable  Complications: No apparent anesthesia complications

## 2012-06-06 NOTE — Interval H&P Note (Signed)
History and Physical Interval Note:  No change in h and p  06/06/2012 9:35 AM  Shelly [name]  has presented today for surgery, with the diagnosis of Left inguinal hernia  The various methods of treatment have been discussed with the patient and family. After consideration of risks, benefits and other options for treatment, the patient has consented to  Procedure(s) (LRB) with comments: HERNIA REPAIR INGUINAL ADULT (Left) INSERTION OF MESH (Left) as a surgical intervention .  The patient's history has been reviewed, patient examined, no change in status, stable for surgery.  I have reviewed the patient's chart and labs.  Questions were answered to the patient's satisfaction.     [name]

## 2012-06-06 NOTE — Anesthesia Preprocedure Evaluation (Signed)
Anesthesia Evaluation  Patient identified by MRN, date of birth, ID band Patient awake    Reviewed: Allergy & Precautions, H&P , NPO status , Patient's Chart, lab work & pertinent test results  Airway Mallampati: II TM Distance: <3 FB Neck ROM: Full    Dental No notable dental hx.    Pulmonary Current Smoker,  breath sounds clear to auscultation  Pulmonary exam normal - decreased breath sounds      Cardiovascular hypertension, Pt. on medications Rhythm:Regular Rate:Normal     Neuro/Psych negative neurological ROS  negative psych ROS   GI/Hepatic Neg liver ROS, GERD-  Medicated,  Endo/Other  negative endocrine ROS  Renal/GU negative Renal ROS  negative genitourinary   Musculoskeletal negative musculoskeletal ROS (+)   Abdominal   Peds negative pediatric ROS (+)  Hematology negative hematology ROS (+)   Anesthesia Other Findings   Reproductive/Obstetrics negative OB ROS                           Anesthesia Physical Anesthesia Plan  ASA: III  Anesthesia Plan: General   Post-op Pain Management:    Induction: Intravenous  Airway Management Planned: LMA  Additional Equipment:   Intra-op Plan:   Post-operative Plan:   Informed Consent: I have reviewed the patients History and Physical, chart, labs and discussed the procedure including the risks, benefits and alternatives for the proposed anesthesia with the patient or authorized representative who has indicated his/her understanding and acceptance.   Dental advisory given  Plan Discussed with: CRNA and Surgeon  Anesthesia Plan Comments:         Anesthesia Quick Evaluation

## 2012-06-06 NOTE — Transfer of Care (Signed)
Immediate Anesthesia Transfer of Care Note  Patient: Shelly [name]  Procedure(s) Performed: Procedure(s) (LRB) with comments: HERNIA REPAIR INGUINAL ADULT (Left) INSERTION OF MESH (Left)  Patient Location: PACU  Anesthesia Type: General  Level of Consciousness: awake, sedated and patient cooperative  Airway & Oxygen Therapy: Patient Spontanous Breathing and Patient connected to face mask oxygen  Post-op Assessment: Report given to PACU RN and Post -op Vital signs reviewed and stable  Post vital signs: Reviewed and stable  Complications: No apparent anesthesia complications

## 2012-06-09 ENCOUNTER — Encounter (HOSPITAL_COMMUNITY): Payer: Self-pay | Admitting: Surgery

## 2012-06-09 ENCOUNTER — Telehealth (INDEPENDENT_AMBULATORY_CARE_PROVIDER_SITE_OTHER): Payer: Self-pay | Admitting: General Surgery

## 2012-06-09 NOTE — Telephone Encounter (Signed)
Pt called to be sure she understood the dressing instructions---remove top dressing to expose steri-strips.  She stayed on the phone to do this.  Also gave her the okay to wear panties (not tight,) and to use an ice pack for the swelling.

## 2012-06-24 ENCOUNTER — Ambulatory Visit (INDEPENDENT_AMBULATORY_CARE_PROVIDER_SITE_OTHER): Payer: Medicare Other | Admitting: Surgery

## 2012-06-24 ENCOUNTER — Encounter (INDEPENDENT_AMBULATORY_CARE_PROVIDER_SITE_OTHER): Payer: Self-pay | Admitting: Surgery

## 2012-06-24 VITALS — BP 126/60 | HR 60 | Temp 98.4°F | Resp 18 | Ht 65.0 in | Wt 157.2 lb

## 2012-06-24 DIAGNOSIS — Z09 Encounter for follow-up examination after completed treatment for conditions other than malignant neoplasm: Secondary | ICD-10-CM

## 2012-06-24 NOTE — Progress Notes (Signed)
Subjective:     Patient ID: Shelly [name], female   DOB: July 01, 1941, 71 y.o.   MRN: [id number]  HPI She is here for her first postoperative visit status post left inguinal hernia repair with mesh. She has some mild to moderate incisional discomfort but is otherwise doing well  Review of Systems     Objective:   Physical Exam On exam, her incision is healing well and there is no evidence of recurrence    Assessment:     Patient status post left inguinal hernia repair with mesh    Plan:     She will refrain from heavy lifting for 2 more weeks. She may then return to normal activity. I will see her back as needed.

## 2012-09-08 ENCOUNTER — Telehealth (INDEPENDENT_AMBULATORY_CARE_PROVIDER_SITE_OTHER): Payer: Self-pay | Admitting: General Surgery

## 2012-09-08 NOTE — Telephone Encounter (Signed)
Pt called to ask about using Miralax.  She has had problems with constipation since having surgery in late September.  General discussion of Miralax and dosage.  Questions answered.

## 2012-09-12 ENCOUNTER — Other Ambulatory Visit: Payer: Self-pay | Admitting: Cardiology

## 2012-09-15 ENCOUNTER — Other Ambulatory Visit: Payer: Self-pay | Admitting: *Deleted

## 2012-09-15 MED ORDER — AMLODIPINE BESYLATE 10 MG PO TABS: 10.0000 mg | ORAL_TABLET | Freq: Every day | ORAL | Status: DC

## 2012-10-03 ENCOUNTER — Telehealth: Payer: Self-pay | Admitting: Gastroenterology

## 2012-10-03 NOTE — Telephone Encounter (Signed)
Dr Jarold Motto, OK for pt to change to Dr Juanda Chance? Thanks.

## 2012-10-06 NOTE — Telephone Encounter (Signed)
Good idea. 

## 2012-10-06 NOTE — Telephone Encounter (Signed)
Dr Jarold Motto diagnosed the inguinal hernia during her OV 03/2012, she had it fixed. later. I don't see anything wrong he did. I will not accept.

## 2012-10-06 NOTE — Telephone Encounter (Signed)
DR Juanda Chance, will you accept this pt as a New Patient? Thanks.

## 2012-10-07 NOTE — Telephone Encounter (Signed)
Spoke with pt to inform her I will have to discuss her situation and call her back.

## 2012-10-07 NOTE — Telephone Encounter (Signed)
Line busy

## 2012-10-10 NOTE — Telephone Encounter (Signed)
Informed pt that I spoke with Dr Leone Payor and he will be glad to see her as a new pt. Pt will let me know if she wishes to make an appt.

## 2012-10-18 ENCOUNTER — Other Ambulatory Visit: Payer: Self-pay | Admitting: Cardiology

## 2012-11-24 ENCOUNTER — Other Ambulatory Visit: Payer: Self-pay | Admitting: Cardiology

## 2013-05-19 ENCOUNTER — Other Ambulatory Visit: Payer: Self-pay | Admitting: Cardiology

## 2013-05-19 NOTE — Telephone Encounter (Signed)
Patient hasnt been seen by Dr Daleen Squibb in some time, called her regarding need or refill on medication norvasc, she will call in for appt today

## 2013-06-22 ENCOUNTER — Encounter: Payer: Medicare Other | Admitting: Cardiovascular Disease

## 2013-07-15 ENCOUNTER — Ambulatory Visit (HOSPITAL_COMMUNITY): Payer: Medicare Other | Attending: Cardiology

## 2013-07-15 ENCOUNTER — Encounter: Payer: Self-pay | Admitting: Cardiovascular Disease

## 2013-07-15 ENCOUNTER — Ambulatory Visit (INDEPENDENT_AMBULATORY_CARE_PROVIDER_SITE_OTHER): Payer: Medicare Other | Admitting: Cardiovascular Disease

## 2013-07-15 VITALS — BP 140/60 | HR 56 | Wt 157.0 lb

## 2013-07-15 DIAGNOSIS — I658 Occlusion and stenosis of other precerebral arteries: Secondary | ICD-10-CM | POA: Insufficient documentation

## 2013-07-15 DIAGNOSIS — I10 Essential (primary) hypertension: Secondary | ICD-10-CM

## 2013-07-15 DIAGNOSIS — R0989 Other specified symptoms and signs involving the circulatory and respiratory systems: Secondary | ICD-10-CM

## 2013-07-15 DIAGNOSIS — I6529 Occlusion and stenosis of unspecified carotid artery: Secondary | ICD-10-CM

## 2013-07-15 DIAGNOSIS — R42 Dizziness and giddiness: Secondary | ICD-10-CM | POA: Insufficient documentation

## 2013-07-15 DIAGNOSIS — I6523 Occlusion and stenosis of bilateral carotid arteries: Secondary | ICD-10-CM

## 2013-07-15 MED ORDER — AMLODIPINE BESYLATE 10 MG PO TABS: 10.0000 mg | ORAL_TABLET | Freq: Every day | ORAL | Status: DC

## 2013-07-15 NOTE — Progress Notes (Signed)
Patient ID: Shelly [name], female   DOB: [birth year], 72 y.o.   MRN: [id number] 72 yo patient new to me Previously seen by Dr Daleen Squibb last in 2011.  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 Has had a rough year. Husband died of cancer in 04-02-23 Needs refill on her amlodipine for BP.  No history of CAD, No chest pain. Bilateral arthritic shoulder pain.  Allergic to aspirin.  Active.  Walks daily with no dyspnea palpitations Or syncope. Compliant with meds. Tries to watch salt.  Has known carotid disease.  01/02/11 40-59% bilateral ICA stenosis      ROS: Denies fever, malais, weight loss, blurry vision, decreased visual acuity, cough, sputum, SOB, hemoptysis, pleuritic pain, palpitaitons, heartburn, abdominal pain, melena, lower extremity edema, claudication, or rash.  All other systems reviewed and negative  General: Affect appropriate Healthy:  appears stated age HEENT: normal Neck supple with no adenopathy JVP normal rigth bruits no thyromegaly Lungs clear with no wheezing and good diaphragmatic motion Heart:  S1/S2 no murmur, no rub, gallop or click PMI normal Abdomen: benighn, BS positve, no tenderness, no AAA no bruit.  No HSM or HJR Distal pulses intact with no bruits No edema Neuro non-focal Skin warm and dry No muscular weakness   Current Outpatient Prescriptions  Medication Sig Dispense Refill  . acetaminophen (TYLENOL) 500 MG tablet Take 500 mg by mouth every 6 (six) hours as needed. For pain      . amLODipine (NORVASC) 10 MG tablet TAKE 1 TABLET (10 MG TOTAL) BY MOUTH DAILY.  30 tablet  0  . furosemide (LASIX) 20 MG tablet Take 1/4 tablet by mouth every 6 hours as needed for peripheral edema      . latanoprost (XALATAN) 0.005 % ophthalmic solution Place 1 drop into both eyes at bedtime.       . ranitidine (ZANTAC) 150 MG capsule Take 150 mg by mouth as needed. For indigestion      . timolol (TIMOPTIC) 0.5 % ophthalmic solution Place 1 drop into both eyes every morning.         No current facility-administered medications for this visit.    Allergies  Aspirin; Iohexol; Naproxen; Penicillins; and Shellfish allergy  Electrocardiogram:  SR rate 56 Inferior lateral Q waves not significant no change from 2012  Assessment and Plan

## 2013-07-15 NOTE — Assessment & Plan Note (Signed)
Well controlled.  Continue current medications and low sodium Dash type diet.   Amolodipine refill called in

## 2013-07-15 NOTE — Assessment & Plan Note (Signed)
Right bruit F/U duplex consider plavix if there is progression given asa allergy

## 2013-07-15 NOTE — Patient Instructions (Signed)
Your physician wants you to follow-up in:   YEAR  WITH  DR NISHAN You will receive a reminder letter in the mail two months in advance. If you don't receive a letter, please call our office to schedule the follow-up appointment. Your physician recommends that you continue on your current medications as directed. Please refer to the Current Medication list given to you today. Your physician has requested that you have a carotid duplex. This test is an ultrasound of the carotid arteries in your neck. It looks at blood flow through these arteries that supply the brain with blood. Allow one hour for this exam. There are no restrictions or special instructions.  

## 2013-07-21 ENCOUNTER — Telehealth: Payer: Self-pay | Admitting: Cardiovascular Disease

## 2013-07-21 ENCOUNTER — Other Ambulatory Visit: Payer: Self-pay | Admitting: *Deleted

## 2013-07-21 DIAGNOSIS — I10 Essential (primary) hypertension: Secondary | ICD-10-CM

## 2013-07-21 MED ORDER — AMLODIPINE BESYLATE 10 MG PO TABS: 10.0000 mg | ORAL_TABLET | Freq: Every day | ORAL | Status: DC

## 2013-07-21 NOTE — Telephone Encounter (Signed)
QUESTIONS  ANSWERED RE   CAROTID  .Shelly [name]

## 2013-07-21 NOTE — Telephone Encounter (Signed)
Follow up    Pt would like to speak to you about her Doppler test please give her a call back

## 2014-01-12 ENCOUNTER — Ambulatory Visit: Payer: Medicare Other | Admitting: Internal Medicine

## 2014-03-11 ENCOUNTER — Ambulatory Visit: Payer: Medicare Other | Admitting: Internal Medicine

## 2014-05-04 ENCOUNTER — Ambulatory Visit: Payer: Medicare Other | Admitting: Internal Medicine

## 2014-06-09 ENCOUNTER — Telehealth: Payer: Self-pay

## 2014-06-09 NOTE — Telephone Encounter (Signed)
Spoke to pt about AWV. She stated that she will not come in unless she is sick.

## 2014-06-15 ENCOUNTER — Ambulatory Visit: Payer: Medicare Other | Admitting: Internal Medicine

## 2014-08-10 ENCOUNTER — Telehealth: Payer: Self-pay | Admitting: Internal Medicine

## 2014-08-10 ENCOUNTER — Ambulatory Visit: Payer: Medicare Other | Admitting: Internal Medicine

## 2014-08-10 NOTE — Telephone Encounter (Signed)
Dr Brodie, do you want to charge late cancellation fee? 

## 2014-08-18 ENCOUNTER — Other Ambulatory Visit: Payer: Self-pay | Admitting: Cardiovascular Disease

## 2014-08-23 DIAGNOSIS — H401131 Primary open-angle glaucoma, bilateral, mild stage: Secondary | ICD-10-CM | POA: Insufficient documentation

## 2014-09-21 ENCOUNTER — Ambulatory Visit: Payer: Medicare Other | Admitting: Internal Medicine

## 2014-09-21 DIAGNOSIS — H40033 Anatomical narrow angle, bilateral: Secondary | ICD-10-CM | POA: Diagnosis not present

## 2014-09-21 DIAGNOSIS — H2513 Age-related nuclear cataract, bilateral: Secondary | ICD-10-CM | POA: Diagnosis not present

## 2014-10-26 ENCOUNTER — Ambulatory Visit: Payer: Self-pay | Admitting: Internal Medicine

## 2014-11-09 ENCOUNTER — Other Ambulatory Visit: Payer: Self-pay | Admitting: Cardiovascular Disease

## 2014-12-28 ENCOUNTER — Ambulatory Visit: Payer: Self-pay | Admitting: Internal Medicine

## 2014-12-30 ENCOUNTER — Ambulatory Visit: Payer: Self-pay | Admitting: Internal Medicine

## 2015-01-03 ENCOUNTER — Telehealth: Payer: Self-pay | Admitting: *Deleted

## 2015-01-03 NOTE — Telephone Encounter (Signed)
-----   Message from Hart Carwinora M Brodie, MD sent at 01/02/2015  1:28 PM EDT ----- Regarding: Dolan AmenV Shelly [name], this lady has canceled several times, al least 4x. Can you call her i f she is coming, please? She  Is not allowed to reschedule any more. Dr Leone PayorGessner said he would see her in the past when she wanted to switch from Dr Jarold MottoPatterson because she was unhappy about his decision making.This appointment is her last chance.  Also, I have only 8 patients on my schedule for  Tue 01/04/2015- opening at 8.45 am,, also opening for a procedure on Fri  4/29, at 9.30 am,  Please call previsit nurse to try to fill it. I will try to fill it from my office  On Tuesday.  Thanx.

## 2015-01-03 NOTE — Telephone Encounter (Signed)
Cancelled OV per patient.

## 2015-01-04 ENCOUNTER — Ambulatory Visit: Payer: Self-pay | Admitting: Internal Medicine

## 2015-01-05 ENCOUNTER — Other Ambulatory Visit: Payer: Self-pay | Admitting: Cardiovascular Disease

## 2015-01-27 ENCOUNTER — Encounter: Payer: Self-pay | Admitting: Internal Medicine

## 2015-01-27 ENCOUNTER — Other Ambulatory Visit (INDEPENDENT_AMBULATORY_CARE_PROVIDER_SITE_OTHER): Payer: Medicare Other

## 2015-01-27 ENCOUNTER — Other Ambulatory Visit: Payer: Self-pay | Admitting: Internal Medicine

## 2015-01-27 ENCOUNTER — Ambulatory Visit (INDEPENDENT_AMBULATORY_CARE_PROVIDER_SITE_OTHER): Payer: Medicare Other | Admitting: Internal Medicine

## 2015-01-27 VITALS — BP 124/72 | HR 69 | Temp 97.6°F | Wt 162.0 lb

## 2015-01-27 DIAGNOSIS — E785 Hyperlipidemia, unspecified: Secondary | ICD-10-CM | POA: Diagnosis not present

## 2015-01-27 DIAGNOSIS — Z0000 Encounter for general adult medical examination without abnormal findings: Secondary | ICD-10-CM

## 2015-01-27 DIAGNOSIS — R7989 Other specified abnormal findings of blood chemistry: Secondary | ICD-10-CM

## 2015-01-27 DIAGNOSIS — E782 Mixed hyperlipidemia: Secondary | ICD-10-CM | POA: Insufficient documentation

## 2015-01-27 DIAGNOSIS — K219 Gastro-esophageal reflux disease without esophagitis: Secondary | ICD-10-CM | POA: Insufficient documentation

## 2015-01-27 DIAGNOSIS — I10 Essential (primary) hypertension: Secondary | ICD-10-CM | POA: Insufficient documentation

## 2015-01-27 DIAGNOSIS — Z0189 Encounter for other specified special examinations: Secondary | ICD-10-CM | POA: Diagnosis not present

## 2015-01-27 DIAGNOSIS — H409 Unspecified glaucoma: Secondary | ICD-10-CM | POA: Insufficient documentation

## 2015-01-27 DIAGNOSIS — Z87442 Personal history of urinary calculi: Secondary | ICD-10-CM | POA: Insufficient documentation

## 2015-01-27 DIAGNOSIS — F419 Anxiety disorder, unspecified: Secondary | ICD-10-CM | POA: Insufficient documentation

## 2015-01-27 DIAGNOSIS — K589 Irritable bowel syndrome without diarrhea: Secondary | ICD-10-CM | POA: Insufficient documentation

## 2015-01-27 DIAGNOSIS — M1990 Unspecified osteoarthritis, unspecified site: Secondary | ICD-10-CM | POA: Insufficient documentation

## 2015-01-27 DIAGNOSIS — Z0001 Encounter for general adult medical examination with abnormal findings: Secondary | ICD-10-CM | POA: Insufficient documentation

## 2015-01-27 LAB — URINALYSIS, ROUTINE W REFLEX MICROSCOPIC
BILIRUBIN URINE: NEGATIVE
KETONES UR: NEGATIVE
Leukocytes, UA: NEGATIVE
Nitrite: NEGATIVE
RBC / HPF: NONE SEEN (ref 0–?)
Specific Gravity, Urine: 1.015 (ref 1.000–1.030)
TOTAL PROTEIN, URINE-UPE24: NEGATIVE
URINE GLUCOSE: NEGATIVE
UROBILINOGEN UA: 0.2 (ref 0.0–1.0)
WBC UA: NONE SEEN (ref 0–?)
pH: 6.5 (ref 5.0–8.0)

## 2015-01-27 LAB — CBC WITH DIFFERENTIAL/PLATELET
BASOS ABS: 0 10*3/uL (ref 0.0–0.1)
BASOS PCT: 0.7 % (ref 0.0–3.0)
EOS PCT: 2.9 % (ref 0.0–5.0)
Eosinophils Absolute: 0.2 10*3/uL (ref 0.0–0.7)
HEMATOCRIT: 40.8 % (ref 36.0–46.0)
HEMOGLOBIN: 13.8 g/dL (ref 12.0–15.0)
LYMPHS ABS: 2.2 10*3/uL (ref 0.7–4.0)
Lymphocytes Relative: 34.6 % (ref 12.0–46.0)
MCHC: 33.7 g/dL (ref 30.0–36.0)
MCV: 85.3 fl (ref 78.0–100.0)
MONO ABS: 0.5 10*3/uL (ref 0.1–1.0)
MONOS PCT: 7.9 % (ref 3.0–12.0)
Neutro Abs: 3.4 10*3/uL (ref 1.4–7.7)
Neutrophils Relative %: 53.9 % (ref 43.0–77.0)
PLATELETS: 180 10*3/uL (ref 150.0–400.0)
RBC: 4.78 Mil/uL (ref 3.87–5.11)
RDW: 14 % (ref 11.5–15.5)
WBC: 6.4 10*3/uL (ref 4.0–10.5)

## 2015-01-27 LAB — LDL CHOLESTEROL, DIRECT: Direct LDL: 89 mg/dL

## 2015-01-27 LAB — HEPATIC FUNCTION PANEL
ALT: 16 U/L (ref 0–35)
AST: 20 U/L (ref 0–37)
Albumin: 4.2 g/dL (ref 3.5–5.2)
Alkaline Phosphatase: 80 U/L (ref 39–117)
BILIRUBIN DIRECT: 0.1 mg/dL (ref 0.0–0.3)
BILIRUBIN TOTAL: 0.5 mg/dL (ref 0.2–1.2)
Total Protein: 6.8 g/dL (ref 6.0–8.3)

## 2015-01-27 LAB — BASIC METABOLIC PANEL
BUN: 16 mg/dL (ref 6–23)
CHLORIDE: 104 meq/L (ref 96–112)
CO2: 27 mEq/L (ref 19–32)
Calcium: 9.5 mg/dL (ref 8.4–10.5)
Creatinine, Ser: 0.88 mg/dL (ref 0.40–1.20)
GFR: 67.14 mL/min (ref >60.00)
Glucose, Bld: 91 mg/dL (ref 70–99)
POTASSIUM: 3.8 meq/L (ref 3.5–5.1)
SODIUM: 138 meq/L (ref 135–145)

## 2015-01-27 LAB — LIPID PANEL
CHOLESTEROL: 320 mg/dL — AB (ref 0–200)
HDL: 39.8 mg/dL (ref >39.00)
Total CHOL/HDL Ratio: 8

## 2015-01-27 LAB — TSH: TSH: 6.12 u[IU]/mL — AB (ref 0.35–4.50)

## 2015-01-27 MED ORDER — FENOFIBRATE 145 MG PO TABS: 145.0000 mg | ORAL_TABLET | Freq: Every day | ORAL | Status: DC

## 2015-01-27 NOTE — Patient Instructions (Signed)

## 2015-01-27 NOTE — Progress Notes (Signed)
Subjective:    Patient ID: Shelly [name], female    DOB: 07/01/1941, 74 y.o.   MRN: [id number]  HPI  Here for wellness and f/u;  Overall doing ok;  Pt denies Chest pain, worsening SOB, DOE, wheezing, orthopnea, PND, worsening LE edema, palpitations, dizziness or syncope.  Pt denies neurological change such as new headache, facial or extremity weakness.  Pt denies polydipsia, polyuria, or low sugar symptoms. Pt states overall good compliance with treatment and medications, good tolerability, and has been trying to follow appropriate diet.  Pt denies worsening depressive symptoms, suicidal ideation or panic. No fever, night sweats, wt loss, loss of appetite, or other constitutional symptoms.  Pt states good ability with ADL's, has low fall risk, home safety reviewed and adequate, no other significant changes in hearing or vision, and only occasionally active with exercise.  Only take amlodipine every few days as BP seems to be < 140/90 at home.  Husband died in 04/08/2013 with met cancer. Past Medical History  Diagnosis Date  . Hemorrhoids   . IBS (irritable bowel syndrome)   . Diverticulosis 04/21/2007  . GERD (gastroesophageal reflux disease)   . History of angioedema   . History of kidney stones   . HTN (hypertension)     stress  . Hiatal hernia 04/21/2007  . Gastritis 04/21/2007  . HLD (hyperlipidemia)   . Arthritis   . Glaucoma   . Anxiety   . History of pneumonia    Past Surgical History  Procedure Laterality Date  . Kidney stone surgery      lithotripsy  . Cholecystectomy    . Appendectomy      age 32  . Bladder repair      repair left ureter- "wrapped around kidney"  . Inguinal hernia repair  06/06/2012    Procedure: HERNIA REPAIR INGUINAL ADULT;  Surgeon: Harl Bowie, MD;  Location: [facility];  Service: General;  Laterality: Left;  . Tonsillectomy      reports that she quit smoking about 2 years ago. Her smoking use included Cigarettes. She smoked 0.25 packs per day. She  has never used smokeless tobacco. She reports that she does not drink alcohol or use illicit drugs. family history includes COPD in her father; Diabetes in her brother; Heart attack in her brother; Pancreatic cancer in her brother and mother. There is no history of Colon cancer. Allergies  Allergen Reactions  . Aspirin Hives  . Iohexol      Code: HIVES, Desc: PT had IVP in 1992- developed severe hives several hours post injection., Onset Date: [id number]   . Naproxen Swelling  . Penicillins Other (See Comments)    unknown  . Shellfish Allergy Hives   Current Outpatient Prescriptions on File Prior to Visit  Medication Sig Dispense Refill  . acetaminophen (TYLENOL) 500 MG tablet Take 500 mg by mouth every 6 (six) hours as needed. For pain    . amLODipine (NORVASC) 10 MG tablet TAKE 1 TABLET (10 MG TOTAL) BY MOUTH DAILY. 7 tablet 0  . furosemide (LASIX) 20 MG tablet Take 1/4 tablet by mouth every 6 hours as needed for peripheral edema    . latanoprost (XALATAN) 0.005 % ophthalmic solution Place 1 drop into both eyes at bedtime.     . ranitidine (ZANTAC) 150 MG capsule Take 150 mg by mouth as needed. For indigestion    . timolol (TIMOPTIC) 0.5 % ophthalmic solution Place 1 drop into both eyes every morning.  No current facility-administered medications on file prior to visit.   Review of Systems Constitutional: Negative for increased diaphoresis, other activity, appetite or siginficant weight change other than noted HENT: Negative for worsening hearing loss, ear pain, facial swelling, mouth sores and neck stiffness.   Eyes: Negative for other worsening pain, redness or visual disturbance.  Respiratory: Negative for shortness of breath and wheezing  Cardiovascular: Negative for chest pain and palpitations.  Gastrointestinal: Negative for diarrhea, blood in stool, abdominal distention or other pain Genitourinary: Negative for hematuria, flank pain or change in urine volume.    Musculoskeletal: Negative for myalgias or other joint complaints.  Skin: Negative for color change and wound or drainage.  Neurological: Negative for syncope and numbness. other than noted Hematological: Negative for adenopathy. or other swelling Psychiatric/Behavioral: Negative for hallucinations, SI, self-injury, decreased concentration or other worsening agitation.      Objective:   Physical Exam BP 124/72 mmHg  Pulse 69  Temp(Src) 97.6 F (36.4 C) (Oral)  Wt 162 lb (73.483 kg)  SpO2 95% VS noted,  Constitutional: Pt is oriented to person, place, and time. Appears well-developed and well-nourished, in no significant distress Head: Normocephalic and atraumatic.  Right Ear: External ear normal.  Left Ear: External ear normal.  Nose: Nose normal.  Mouth/Throat: Oropharynx is clear and moist.  Eyes: Conjunctivae and EOM are normal. Pupils are equal, round, and reactive to light.  Neck: Normal range of motion. Neck supple. No JVD present. No tracheal deviation present or significant neck LA or mass Cardiovascular: Normal rate, regular rhythm, normal heart sounds and intact distal pulses.   Pulmonary/Chest: Effort normal and breath sounds without rales or wheezing  Abdominal: Soft. Bowel sounds are normal. NT. No HSM  Musculoskeletal: Normal range of motion. Exhibits no edema.  Lymphadenopathy:  Has no cervical adenopathy.  Neurological: Pt is alert and oriented to person, place, and time. Pt has normal reflexes. No cranial nerve deficit. Motor grossly intact Skin: Skin is warm and dry. No rash noted.  Psychiatric:  Has normal mood and affect. Behavior is normal.     Assessment & Plan:

## 2015-01-27 NOTE — Assessment & Plan Note (Signed)

## 2015-01-27 NOTE — Progress Notes (Signed)
Pre visit review using our clinic review tool, if applicable. No additional management support is needed unless otherwise documented below in the visit note. 

## 2015-01-28 ENCOUNTER — Telehealth: Payer: Self-pay | Admitting: Internal Medicine

## 2015-01-28 NOTE — Telephone Encounter (Signed)
Patient is requesting a call back in regards to labs.  She would like info on how to get "500" down?  She would like literature to be sent out as well if possible.

## 2015-01-28 NOTE — Telephone Encounter (Signed)
Pt advised that MD prescribed medication treat as well as lifestyle changes. Information as well as lab results mailed

## 2015-02-04 NOTE — Telephone Encounter (Signed)
She was wanting information on eating healthy. What she can and can't eat. All she received was her lab results. She says she doesn't want to come back in to see him for six months. Can you please send her this information today, please

## 2015-02-23 ENCOUNTER — Ambulatory Visit: Payer: Self-pay | Admitting: Gastroenterology

## 2015-10-03 ENCOUNTER — Other Ambulatory Visit: Payer: Self-pay | Admitting: Cardiovascular Disease

## 2015-10-03 MED ORDER — AMLODIPINE BESYLATE 10 MG PO TABS: ORAL_TABLET | ORAL | Status: DC

## 2015-10-17 NOTE — Progress Notes (Signed)
Patient ID: Shelly [name], female   DOB: [birth year], 75 y.o.   MRN: [id number] 75 y.o.  patient last seen 07/2013  Previously seen by Dr Daleen Squibb last in 2011.  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  Husband died of cancer in May 02, 2013 Needs refill on her amlodipine for BP.  No history of CAD, No chest pain. Bilateral arthritic shoulder pain.  Allergic to aspirin.  Active.  Walks daily with no dyspnea palpitations Or syncope. Compliant with meds. Tries to watch salt.  Has known carotid disease.  07/15/13  rigth  ICA stenosis 40-59%     ROS: Denies fever, malais, weight loss, blurry vision, decreased visual acuity, cough, sputum, SOB, hemoptysis, pleuritic pain, palpitaitons, heartburn, abdominal pain, melena, lower extremity edema, claudication, or rash.  All other systems reviewed and negative  General: Affect appropriate Healthy:  appears stated age HEENT: normal Neck supple with no adenopathy JVP normal rigth bruits no thyromegaly Lungs clear with no wheezing and good diaphragmatic motion Heart:  S1/S2 no murmur, no rub, gallop or click PMI normal Abdomen: benighn, BS positve, no tenderness, no AAA no bruit.  No HSM or HJR Distal pulses intact with no bruits No edema Neuro non-focal Skin warm and dry No muscular weakness   Current Outpatient Prescriptions  Medication Sig Dispense Refill  . acetaminophen (TYLENOL) 500 MG tablet Take 500 mg by mouth every 6 (six) hours as needed. For pain    . amLODipine (NORVASC) 10 MG tablet TAKE 1 TABLET (10 MG TOTAL) BY MOUTH DAILY. 30 tablet 0  . furosemide (LASIX) 20 MG tablet Take 1/4 tablet by mouth every 3rd day as needed for peripheral edema    . latanoprost (XALATAN) 0.005 % ophthalmic solution Place 1 drop into both eyes at bedtime.     . ranitidine (ZANTAC) 150 MG capsule Take 150 mg by mouth daily as needed for heartburn.     . timolol (TIMOPTIC) 0.5 % ophthalmic solution Place 1 drop into both eyes every morning.      No  current facility-administered medications for this visit.    Allergies  Aspirin; Iohexol; Naproxen; Penicillins; Shellfish allergy; Dorzolamide hcl-timolol mal pf; and Latanoprost  Electrocardiogram:  SR rate 56 Inferior lateral Q waves not significant no change from 2012  07/15/13  SR cannot r/o inferolateral MI  Narrow Q waves  10/18/15  SR rate 64  Normal with non significant inferolateral Q waves  Assessment and Plan Carotid Disease: f/u duplex moderate disease 2014  HTN:  Well controlled.  Continue current medications and low sodium Dash type diet.  Norvasc refills called in  Chol:  No results found for: LDLCALC GERD:  On zantac avoid late night meals and carb excess Abnormal ECG:  F/u echo r/o RWMA;s  ECG better today   Charlton Haws

## 2015-10-18 ENCOUNTER — Ambulatory Visit (INDEPENDENT_AMBULATORY_CARE_PROVIDER_SITE_OTHER): Payer: Medicare Other | Admitting: Cardiovascular Disease

## 2015-10-18 ENCOUNTER — Encounter: Payer: Self-pay | Admitting: Cardiovascular Disease

## 2015-10-18 VITALS — BP 140/70 | HR 68 | Ht 65.0 in | Wt 172.4 lb

## 2015-10-18 DIAGNOSIS — I2510 Atherosclerotic heart disease of native coronary artery without angina pectoris: Secondary | ICD-10-CM | POA: Diagnosis not present

## 2015-10-18 DIAGNOSIS — I10 Essential (primary) hypertension: Secondary | ICD-10-CM | POA: Diagnosis not present

## 2015-10-18 DIAGNOSIS — Z0000 Encounter for general adult medical examination without abnormal findings: Secondary | ICD-10-CM | POA: Diagnosis not present

## 2015-10-18 MED ORDER — AMLODIPINE BESYLATE 10 MG PO TABS: ORAL_TABLET | ORAL | Status: DC

## 2015-10-18 NOTE — Patient Instructions (Signed)
Medication Instructions:  Your physician recommends that you continue on your current medications as directed. Please refer to the Current Medication list given to you today.  Labwork: NONE  Testing/Procedures: Your physician has requested that you have a carotid duplex. This test is an ultrasound of the carotid arteries in your neck. It looks at blood flow through these arteries that supply the brain with blood. Allow one hour for this exam. There are no restrictions or special instructions.  Follow-Up: Your physician wants you to follow-up in: 1 year with Dr. Eden Emms. You will receive a reminder letter in the mail two months in advance. If you don't receive a letter, please call our office to schedule the follow-up appointment.   If you need a refill on your cardiac medications before your next appointment, please call your pharmacy.

## 2016-03-16 ENCOUNTER — Other Ambulatory Visit: Payer: Self-pay | Admitting: *Deleted

## 2016-03-16 ENCOUNTER — Telehealth: Payer: Self-pay | Admitting: Emergency Medicine

## 2016-03-16 MED ORDER — FUROSEMIDE 20 MG PO TABS: ORAL_TABLET | ORAL | Status: DC

## 2016-03-16 NOTE — Telephone Encounter (Signed)
Please advise 

## 2016-03-16 NOTE — Telephone Encounter (Signed)
Pt last seen by me may 2016  Pt has No LE edema when seen per Dr Eden EmmsNishan in Feb 2017  Please make ROV to discuss need for fluid pill, thanks

## 2016-03-16 NOTE — Telephone Encounter (Signed)
Patient called and stated she needs a fluid pill to take PRN. Pharmacy is [facility]. Thanks

## 2016-03-19 NOTE — Telephone Encounter (Signed)
Did inform patient.  States she will have to call back to make appt.

## 2016-06-25 ENCOUNTER — Other Ambulatory Visit: Payer: Self-pay | Admitting: Cardiovascular Disease

## 2016-06-25 DIAGNOSIS — I2510 Atherosclerotic heart disease of native coronary artery without angina pectoris: Secondary | ICD-10-CM

## 2016-06-25 DIAGNOSIS — I6523 Occlusion and stenosis of bilateral carotid arteries: Secondary | ICD-10-CM

## 2016-06-25 DIAGNOSIS — I10 Essential (primary) hypertension: Secondary | ICD-10-CM

## 2016-07-18 ENCOUNTER — Telehealth: Payer: Self-pay | Admitting: Cardiovascular Disease

## 2016-07-18 NOTE — Telephone Encounter (Signed)
Patient refused to have carotid duplex test done. Patient stated that she would see Dr. Eden EmmsNishan in 2018 at her next office visit, but she is not willing to have carotids done at this time.

## 2016-07-18 NOTE — Telephone Encounter (Signed)
NEw Message  Pt call requesting to speak with RN about why she needs to have carotid complete. Pt wanted to cancel carotid at [facility] and reschedule for [facility] if they will do the procedure. Please call back to discuss

## 2016-07-23 ENCOUNTER — Inpatient Hospital Stay (HOSPITAL_COMMUNITY): Admission: RE | Admit: 2016-07-23 | Payer: Medicare Other | Source: Ambulatory Visit

## 2016-10-12 ENCOUNTER — Ambulatory Visit (INDEPENDENT_AMBULATORY_CARE_PROVIDER_SITE_OTHER): Payer: Medicare Other | Admitting: Internal Medicine

## 2016-10-12 DIAGNOSIS — I10 Essential (primary) hypertension: Secondary | ICD-10-CM

## 2016-10-12 DIAGNOSIS — M109 Gout, unspecified: Secondary | ICD-10-CM

## 2016-10-12 DIAGNOSIS — F419 Anxiety disorder, unspecified: Secondary | ICD-10-CM

## 2016-10-12 MED ORDER — PREDNISONE 10 MG PO TABS: ORAL_TABLET | ORAL | 0 refills | Status: DC

## 2016-10-12 MED ORDER — COLCHICINE 0.6 MG PO TABS: ORAL_TABLET | ORAL | 2 refills | Status: DC

## 2016-10-12 MED ORDER — FUROSEMIDE 20 MG PO TABS: ORAL_TABLET | ORAL | 5 refills | Status: DC

## 2016-10-12 MED ORDER — PREDNISONE 10 MG PO TABS
ORAL_TABLET | ORAL | 0 refills | Status: DC
Start: 2016-10-12 — End: 2016-10-12

## 2016-10-12 NOTE — Progress Notes (Signed)
Pre visit review using our clinic review tool, if applicable. No additional management support is needed unless otherwise documented below in the visit note. 

## 2016-10-12 NOTE — Assessment & Plan Note (Signed)
stable overall by history and exam, recent data reviewed with pt, and pt to continue medical treatment as before,  to f/u any worsening symptoms or concerns BP Readings from Last 3 Encounters:  10/12/16 138/76  10/18/15 140/70  01/27/15 124/72

## 2016-10-12 NOTE — Progress Notes (Signed)
Subjective:    Patient ID: Shelly [name], female    DOB: 05/02/1941, 76 y.o.   MRN: [id number]  HPI  Here with 3 days onset mod to severe left ankle pain with rapid onset swelling, erythema without fever, trauma; somewhat better with asa 81 mg per day the last 2 days, nothing else makes better or worse.  Pt denies chest pain, increased sob or doe, wheezing, orthopnea, PND, increased LE swelling, palpitations, dizziness or syncope.  Pt denies new neurological symptoms such as new headache, or facial or extremity weakness or numbness  Denies worsening depressive symptoms, suicidal ideation, or panic; has ongoing anxiety, No other new hx Past Medical History:  Diagnosis Date  . Anxiety   . Arthritis   . Diverticulosis 04/21/2007  . Gastritis 04/21/2007  . GERD (gastroesophageal reflux disease)   . Glaucoma   . Hemorrhoids   . Hiatal hernia 04/21/2007  . History of angioedema   . History of kidney stones   . History of pneumonia   . HLD (hyperlipidemia)   . HTN (hypertension)    stress  . IBS (irritable bowel syndrome)    Past Surgical History:  Procedure Laterality Date  . APPENDECTOMY     age 76  . BLADDER REPAIR     repair left ureter- "wrapped around kidney"  . CHOLECYSTECTOMY    . INGUINAL HERNIA REPAIR  06/06/2012   Procedure: HERNIA REPAIR INGUINAL ADULT;  Surgeon: Shelly Rubensteinouglas A Blackman, MD;  Location: [facility];  Service: General;  Laterality: Left;  . KIDNEY STONE SURGERY     lithotripsy  . TONSILLECTOMY      reports that she quit smoking about 3 years ago. Her smoking use included Cigarettes. She smoked 0.25 packs per day. She has never used smokeless tobacco. She reports that she does not drink alcohol or use drugs. family history includes COPD in her father; Diabetes in her brother; Heart attack in her brother; Pancreatic cancer in her brother and mother. Allergies  Allergen Reactions  . Aspirin Hives  . Iohexol      Code: HIVES, Desc: PT had IVP in 1992- developed severe  hives several hours post injection., Onset Date: [id number]   . Naproxen Swelling  . Penicillins Other (See Comments)    unknown  . Shellfish Allergy Hives  . Dorzolamide Hcl-Timolol Mal Pf Rash    Frequency urrination  . Latanoprost Rash    Joint pain   Current Outpatient Prescriptions on File Prior to Visit  Medication Sig Dispense Refill  . acetaminophen (TYLENOL) 500 MG tablet Take 500 mg by mouth every 6 (six) hours as needed. For pain    . amLODipine (NORVASC) 10 MG tablet TAKE 1 TABLET (10 MG TOTAL) BY MOUTH DAILY. 90 tablet 3  . latanoprost (XALATAN) 0.005 % ophthalmic solution Place 1 drop into both eyes at bedtime.     . ranitidine (ZANTAC) 150 MG capsule Take 150 mg by mouth daily as needed for heartburn.     . timolol (TIMOPTIC) 0.5 % ophthalmic solution Place 1 drop into both eyes every morning.      No current facility-administered medications on file prior to visit.    Review of Systems  Constitutional: Negative for unusual diaphoresis or night sweats HENT: Negative for ear swelling or discharge Eyes: Negative for worsening visual haziness  Respiratory: Negative for choking and stridor.   Gastrointestinal: Negative for distension or worsening eructation Genitourinary: Negative for retention or change in urine volume.  Musculoskeletal: Negative for other MSK  pain or swelling Skin: Negative for color change and worsening wound Neurological: Negative for tremors and numbness other than noted  Psychiatric/Behavioral: Negative for decreased concentration or agitation other than above  All other system neg per pt    Objective:   Physical Exam BP 138/76   Pulse 68   Resp 20   Wt 171 lb (77.6 kg)   SpO2 95%   BMI 28.46 kg/m   VS noted,  Constitutional: Pt appears in no apparent distress HENT: Head: NCAT.  Right Ear: External ear normal.  Left Ear: External ear normal.  Eyes: . Pupils are equal, round, and reactive to light. Conjunctivae and EOM are normal Neck:  Normal range of motion. Neck supple.  Cardiovascular: Normal rate and regular rhythm.   Pulmonary/Chest: Effort normal and breath sounds without rales or wheezing.  Left ankle 1-2+ swelling, tender Neurological: Pt is alert. Not confused , motor grossly intact Skin: Skin is warm. No rash, no LE edema Psychiatric: Pt behavior is normal. No agitation.    No other exam findings    Assessment & Plan:

## 2016-10-12 NOTE — Assessment & Plan Note (Signed)
stable overall by history and exam, recent data reviewed with pt, and pt to continue medical treatment as before,  to f/u any worsening symptoms or concerns Lab Results  Component Value Date   WBC 6.4 01/27/2015   HGB 13.8 01/27/2015   HCT 40.8 01/27/2015   PLT 180.0 01/27/2015   GLUCOSE 91 01/27/2015   CHOL 320 (H) 01/27/2015   TRIG (H) 01/27/2015    534.0 Triglyceride is over 400; calculations on Lipids are invalid.   HDL 39.80 01/27/2015   LDLDIRECT 89.0 01/27/2015   ALT 16 01/27/2015   AST 20 01/27/2015   NA 138 01/27/2015   K 3.8 01/27/2015   CL 104 01/27/2015   CREATININE 0.88 01/27/2015   BUN 16 01/27/2015   CO2 27 01/27/2015   TSH 6.12 (H) 01/27/2015

## 2016-10-12 NOTE — Patient Instructions (Signed)
You had the steroid shot today   Please take all new medication as prescribed - the prednisone for 5 days, and then the colchicine as needed to help treat after that if if recurs  Please continue all other medications as before, and refills have been done if requested.  Please have the pharmacy call with any other refills you may need.  Please keep your appointments with your specialists as you may have planned

## 2016-10-12 NOTE — Assessment & Plan Note (Signed)
Mild to mod, for depomedrol IM, predpac asd, colchicine prn recurrence, consider uric acid next visit,  to f/u any worsening symptoms or concerns

## 2016-10-15 ENCOUNTER — Telehealth: Payer: Self-pay | Admitting: Internal Medicine

## 2016-10-15 NOTE — Telephone Encounter (Signed)
Patient called back about this. Thanks.

## 2016-10-15 NOTE — Telephone Encounter (Signed)
She should be re-evaluated  - she received an injection, oral prednisone and colchicine and is still having pain - I question if she truly has gout.

## 2016-10-15 NOTE — Telephone Encounter (Signed)
Please advise as PCP is out of the office

## 2016-10-15 NOTE — Telephone Encounter (Signed)
Patient states that she was seen by Dr. Jonny RuizJohn for Gout.  Has changed her medication dosage because of pain.  Would like to know if another provider can prescribe her anything so she does not have to come in.  Patient states she tried to get in touch with [facility] over the weekend and did not get any assistance.

## 2016-10-16 ENCOUNTER — Other Ambulatory Visit: Payer: Self-pay | Admitting: Internal Medicine

## 2016-10-16 NOTE — Telephone Encounter (Signed)
Patient refused appt  

## 2016-10-16 NOTE — Telephone Encounter (Signed)
Please advise in dr john's absence, thanks 

## 2016-10-18 ENCOUNTER — Other Ambulatory Visit: Payer: Self-pay | Admitting: Cardiovascular Disease

## 2016-10-18 ENCOUNTER — Emergency Department (HOSPITAL_COMMUNITY)
Admission: EM | Admit: 2016-10-18 | Discharge: 2016-10-18 | Disposition: A | Discharge status: Home / Self Care | Payer: Medicare Other | Attending: Emergency Medicine | Admitting: Emergency Medicine

## 2016-10-18 ENCOUNTER — Encounter (HOSPITAL_COMMUNITY): Payer: Self-pay

## 2016-10-18 DIAGNOSIS — M109 Gout, unspecified: Secondary | ICD-10-CM

## 2016-10-18 DIAGNOSIS — Z87891 Personal history of nicotine dependence: Secondary | ICD-10-CM | POA: Diagnosis not present

## 2016-10-18 DIAGNOSIS — I10 Essential (primary) hypertension: Secondary | ICD-10-CM | POA: Insufficient documentation

## 2016-10-18 DIAGNOSIS — I2510 Atherosclerotic heart disease of native coronary artery without angina pectoris: Secondary | ICD-10-CM | POA: Insufficient documentation

## 2016-10-18 DIAGNOSIS — M10072 Idiopathic gout, left ankle and foot: Secondary | ICD-10-CM | POA: Insufficient documentation

## 2016-10-18 DIAGNOSIS — Z79899 Other long term (current) drug therapy: Secondary | ICD-10-CM | POA: Insufficient documentation

## 2016-10-18 MED ORDER — COLCHICINE 0.6 MG PO TABS
0.6000 mg | ORAL_TABLET | Freq: Once | ORAL | Status: AC
  Administered 2016-10-18: 0.6 mg via ORAL
  Filled 2016-10-18: qty 1

## 2016-10-18 MED ORDER — COLCHICINE 0.6 MG PO TABS: 0.6000 mg | ORAL_TABLET | Freq: Every day | ORAL | 1 refills | Status: DC

## 2016-10-18 MED ORDER — DEXAMETHASONE SODIUM PHOSPHATE 10 MG/ML IJ SOLN
10.0000 mg | Freq: Once | INTRAMUSCULAR | Status: AC
  Administered 2016-10-18: 10 mg via INTRAMUSCULAR
  Filled 2016-10-18: qty 1

## 2016-10-18 MED ORDER — PREDNISONE 20 MG PO TABS: 20.0000 mg | ORAL_TABLET | Freq: Two times a day (BID) | ORAL | 0 refills | Status: DC

## 2016-10-18 MED ORDER — HYDROCODONE-ACETAMINOPHEN 5-325 MG PO TABS: 1.0000 | ORAL_TABLET | ORAL | 0 refills | Status: DC | PRN

## 2016-10-18 NOTE — ED Triage Notes (Signed)
Pt with left foot pain.  Md thinks it is gout.  Told about gout meds but cannot afford.  Has been given steroids.

## 2016-10-18 NOTE — ED Provider Notes (Signed)
[facility]-EMER[facility]ENCY DEPT Provider Note   CSN: [id number] Arrival date & time: 10/18/16  4098     History   Chief Complaint Chief Complaint  Patient presents with  . Foot Pain    HPI Shelly [name] is a 76 y.o. female. CC: Toe pain  HPI:  Patient's primary care is Dr. Oliver Barre ad lib. hour. She saw him in December with some pain in her toes on her left foot. Diagnosed with gout. Treated with Depo-Medrol, and prednisone. Her symptoms improved. She's had recurrence over the last 3-4 days of left toe pain. Her left lateral 3 toes. States this all painful at night "like four toothaches" that she can't sleep. No injury to the foot or toes.   History of 2 previous kidney stones, presumed uric acid. Past Medical History:  Diagnosis Date  . Anxiety   . Arthritis   . Diverticulosis 04/21/2007  . Gastritis 04/21/2007  . GERD (gastroesophageal reflux disease)   . Glaucoma   . Hemorrhoids   . Hiatal hernia 04/21/2007  . History of angioedema   . History of kidney stones   . History of pneumonia   . HLD (hyperlipidemia)   . HTN (hypertension)    stress  . IBS (irritable bowel syndrome)     Patient Active Problem List   Diagnosis Date Noted  . Acute gout 10/12/2016  . Wellness examination 01/27/2015  . IBS (irritable bowel syndrome)   . GERD (gastroesophageal reflux disease)   . History of kidney stones   . HTN (hypertension)   . HLD (hyperlipidemia)   . Arthritis   . Glaucoma   . Anxiety   . Left inguinal hernia 05/14/2012  . CAROTID ARTERY DISEASE 11/24/2010  . Unspecified essential hypertension 06/05/2008  . PULMONARY NODULE, LEFT LOWER LOBE 06/05/2008  . DIVERTICULOSIS OF COLON 06/05/2008  . ANGIOEDEMA 06/05/2008  . Personal history of other diseases of digestive system 06/05/2008  . NEPHROLITHIASIS, HX OF 06/05/2008  . Cough 06/04/2008  . Diverticulosis 04/21/2007    Past Surgical History:  Procedure Laterality Date  . APPENDECTOMY     age 42  . BLADDER REPAIR     repair left ureter- "wrapped around kidney"  . CHOLECYSTECTOMY    . INGUINAL HERNIA REPAIR  06/06/2012   Procedure: HERNIA REPAIR INGUINAL ADULT;  Surgeon: Shelly Rubenstein, MD;  Location: [facility];  Service: General;  Laterality: Left;  . KIDNEY STONE SURGERY     lithotripsy  . TONSILLECTOMY      OB History    No data available       Home Medications    Prior to Admission medications   Medication Sig Start Date End Date Taking? Authorizing Provider  acetaminophen (TYLENOL) 500 MG tablet Take 500 mg by mouth every 6 (six) hours as needed. For pain   Yes Historical Provider, MD  amLODipine (NORVASC) 10 MG tablet TAKE 1 TABLET (10 MG TOTAL) BY MOUTH DAILY. Patient taking differently: Take 10 mg by mouth every morning.  10/18/15  Yes Wendall Stade, MD  furosemide (LASIX) 20 MG tablet Take 1/4 tablet by mouth every 3rd day as needed for peripheral edema Patient taking differently: Take 5-20 mg by mouth daily as needed for fluid or edema.  10/12/16  Yes Corwin Levins, MD  latanoprost (XALATAN) 0.005 % ophthalmic solution Place 1 drop into both eyes at bedtime.    Yes Historical Provider, MD  naproxen sodium (ANAPROX) 220 MG tablet Take 220 mg by mouth daily as needed (  pain).   Yes Historical Provider, MD  timolol (TIMOPTIC) 0.5 % ophthalmic solution Place 1 drop into both eyes every morning.    Yes Historical Provider, MD  colchicine 0.6 MG tablet Take 1 tablet (0.6 mg total) by mouth daily. 10/18/16   Rolland Porter, MD  HYDROcodone-acetaminophen (NORCO/VICODIN) 5-325 MG tablet Take 1 tablet by mouth every 4 (four) hours as needed. 10/18/16   Rolland Porter, MD  predniSONE (DELTASONE) 20 MG tablet Take 1 tablet (20 mg total) by mouth 2 (two) times daily with a meal. 10/18/16   Rolland Porter, MD    Family History Family History  Problem Relation Age of Onset  . Pancreatic cancer Mother   . COPD Father   . Pancreatic cancer Brother   . Diabetes Brother   . Heart attack Brother   . Colon cancer Neg Hx      Social History Social History  Substance Use Topics  . Smoking status: Former Smoker    Packs/day: 0.25    Types: Cigarettes    Quit date: 11/08/2012  . Smokeless tobacco: Never Used  . Alcohol use No     Allergies   Aspirin; Iohexol; Naproxen; Penicillins; Shellfish allergy; Dorzolamide hcl-timolol mal pf; and Latanoprost   Review of Systems Review of Systems  Constitutional: Negative for appetite change, chills, diaphoresis, fatigue and fever.  HENT: Negative for mouth sores, sore throat and trouble swallowing.   Eyes: Negative for visual disturbance.  Respiratory: Negative for cough, chest tightness, shortness of breath and wheezing.   Cardiovascular: Negative for chest pain.  Gastrointestinal: Negative for abdominal distention, abdominal pain, diarrhea, nausea and vomiting.  Endocrine: Negative for polydipsia, polyphagia and polyuria.  Genitourinary: Negative for dysuria, frequency and hematuria.  Musculoskeletal: Negative for gait problem.  Skin: Negative for color change, pallor and rash.       Left toe pain  Neurological: Negative for dizziness, syncope, light-headedness and headaches.  Hematological: Does not bruise/bleed easily.  Psychiatric/Behavioral: Negative for behavioral problems and confusion.     Physical Exam Updated Vital Signs There were no vitals taken for this visit.  Physical Exam  Constitutional: She is oriented to person, place, and time. She appears well-developed and well-nourished. No distress.  HENT:  Head: Normocephalic.  Eyes: Conjunctivae are normal. Pupils are equal, round, and reactive to light. No scleral icterus.  Neck: Normal range of motion. Neck supple. No thyromegaly present.  Cardiovascular: Normal rate and regular rhythm.  Exam reveals no gallop and no friction rub.   No murmur heard. Pulmonary/Chest: Effort normal and breath sounds normal. No respiratory distress. She has no wheezes. She has no rales.  Abdominal: Soft.  Bowel sounds are normal. She exhibits no distension. There is no tenderness. There is no rebound.  Musculoskeletal: Normal range of motion.  Left foot lateral 3 toes are painful. Minimal erythema. No soft tissue swelling. No ulcerations molar. No cellulitis.  Neurological: She is alert and oriented to person, place, and time.  Skin: Skin is warm and dry. No rash noted.  Psychiatric: She has a normal mood and affect. Her behavior is normal.     ED Treatments / Results  Labs (all labs ordered are listed, but only abnormal results are displayed) Labs Reviewed - No data to display  EKG  EKG Interpretation None       Radiology No results found.  Procedures Procedures (including critical care time)  Medications Ordered in ED Medications  dexamethasone (DECADRON) injection 10 mg (not administered)  colchicine tablet 0.6 mg (  not administered)     Initial Impression / Assessment and Plan / ED Course  I have reviewed the triage vital signs and the nursing notes.  Pertinent labs & imaging results that were available during my care of the patient were reviewed by me and considered in my medical decision making (see chart for details).     Clinically, acute gouty arthritis. Given by mouth colchicine. Given IM Decadron. We'll discharge with prednisone, Vicodin, colchicine. Primary care follow-up with her physician Dr. Oliver BarreJames John.  Final Clinical Impressions(s) / ED Diagnoses   Final diagnoses:  Acute gout of left ankle, unspecified cause    New Prescriptions New Prescriptions   COLCHICINE 0.6 MG TABLET    Take 1 tablet (0.6 mg total) by mouth daily.   HYDROCODONE-ACETAMINOPHEN (NORCO/VICODIN) 5-325 MG TABLET    Take 1 tablet by mouth every 4 (four) hours as needed.   PREDNISONE (DELTASONE) 20 MG TABLET    Take 1 tablet (20 mg total) by mouth 2 (two) times daily with a meal.     Rolland PorterMark Brelan Hannen, MD 10/18/16 1025

## 2016-10-20 ENCOUNTER — Encounter (HOSPITAL_COMMUNITY): Payer: Self-pay | Admitting: Emergency Medicine

## 2016-10-20 ENCOUNTER — Emergency Department (HOSPITAL_COMMUNITY)
Admission: EM | Admit: 2016-10-20 | Discharge: 2016-10-20 | Disposition: A | Discharge status: Home / Self Care | Payer: Medicare Other | Attending: Emergency Medicine | Admitting: Emergency Medicine

## 2016-10-20 DIAGNOSIS — M109 Gout, unspecified: Secondary | ICD-10-CM

## 2016-10-20 DIAGNOSIS — I10 Essential (primary) hypertension: Secondary | ICD-10-CM | POA: Insufficient documentation

## 2016-10-20 DIAGNOSIS — Z87891 Personal history of nicotine dependence: Secondary | ICD-10-CM | POA: Diagnosis not present

## 2016-10-20 MED ORDER — COLCHICINE 0.6 MG PO TABS: 0.6000 mg | ORAL_TABLET | Freq: Every day | ORAL | 0 refills | Status: DC

## 2016-10-20 MED ORDER — PREDNISONE 20 MG PO TABS: 60.0000 mg | ORAL_TABLET | Freq: Every day | ORAL | 0 refills | Status: AC

## 2016-10-20 MED ORDER — DEXAMETHASONE SODIUM PHOSPHATE 10 MG/ML IJ SOLN
10.0000 mg | Freq: Once | INTRAMUSCULAR | Status: AC
  Administered 2016-10-20: 10 mg via INTRAMUSCULAR
  Filled 2016-10-20: qty 1

## 2016-10-20 MED ORDER — HYDROCODONE-ACETAMINOPHEN 5-325 MG PO TABS: 1.0000 | ORAL_TABLET | Freq: Four times a day (QID) | ORAL | 0 refills | Status: DC | PRN

## 2016-10-20 NOTE — ED Provider Notes (Signed)
[facility]-EMERGENCY DEPT Provider Note   CSN: [id number] Arrival date & time: 10/20/16  0902     History   Chief Complaint Chief Complaint  Patient presents with  . Gout    HPI Shelly [name] is a 76 y.o. female with a PMHx of gout, GERD, HTN, HLD, IBS, diverticulosis, and nephrolithiasis, who presents to the ED with complaints of recurrent gout flare that began gradually around 3 AM this morning. Chart review reveals she was seen by her PCP on 10/12/16, diagnosed with gout, given depomedrol IM and started on colchicine and prednisone; seen in ED on 10/18/16 and given decadron IM and continued on colchicine/prednisone/norco. No uric acid levels done by PCP, per his notes they will consider getting it at next visit. No imaging/labs done at last ED visit. Patient states that after her ED visit on 10/18/16, she had significantly improved, and yesterday she did some shopping and had no difficulty ambulating and felt like her gout flare was better. However earlier this morning she awoke with recurrent symptoms at 3 AM. She describes the pain as 10/10 constant aching nonradiating left third through fifth toe pain, worse with standing and walking, and mildly improved with Norco, prednisone 30 mg, and Colchicine 0.6 mg. She hadn't finished all of her colchicine or prednisone from the prior rx, however she is running low and because she can't get in to see her PCP until 11/01/16 she came here for refills. Associated symptoms includes swelling, redness, and warmth of the toes. She denies any injuries sustained. She also denies fevers, chills, CP, SOB, abd pain, N/V/D/C, hematuria, dysuria, myalgias, other arthralgias, ankle pain/swelling, numbness, tingling, focal weakness, or any other complaints at this time.    The history is provided by the patient and medical records. No language interpreter was used.  Foot Pain  This is a recurrent problem. The current episode started 6 to 12 hours ago. The problem occurs  constantly. The problem has not changed since onset.Pertinent negatives include no chest pain, no abdominal pain and no shortness of breath. The symptoms are aggravated by walking and standing. The symptoms are relieved by medications and narcotics. Treatments tried: colchicine, prednisone, and norco. The treatment provided mild relief.    Past Medical History:  Diagnosis Date  . Anxiety   . Arthritis   . Diverticulosis 04/21/2007  . Gastritis 04/21/2007  . GERD (gastroesophageal reflux disease)   . Glaucoma   . Hemorrhoids   . Hiatal hernia 04/21/2007  . History of angioedema   . History of kidney stones   . History of pneumonia   . HLD (hyperlipidemia)   . HTN (hypertension)    stress  . IBS (irritable bowel syndrome)     Patient Active Problem List   Diagnosis Date Noted  . Acute gout 10/12/2016  . Wellness examination 01/27/2015  . IBS (irritable bowel syndrome)   . GERD (gastroesophageal reflux disease)   . History of kidney stones   . HTN (hypertension)   . HLD (hyperlipidemia)   . Arthritis   . Glaucoma   . Anxiety   . Left inguinal hernia 05/14/2012  . CAROTID ARTERY DISEASE 11/24/2010  . Unspecified essential hypertension 06/05/2008  . PULMONARY NODULE, LEFT LOWER LOBE 06/05/2008  . DIVERTICULOSIS OF COLON 06/05/2008  . ANGIOEDEMA 06/05/2008  . Personal history of other diseases of digestive system 06/05/2008  . NEPHROLITHIASIS, HX OF 06/05/2008  . Cough 06/04/2008  . Diverticulosis 04/21/2007    Past Surgical History:  Procedure Laterality Date  .  APPENDECTOMY     age 86  . BLADDER REPAIR     repair left ureter- "wrapped around kidney"  . CHOLECYSTECTOMY    . INGUINAL HERNIA REPAIR  06/06/2012   Procedure: HERNIA REPAIR INGUINAL ADULT;  Surgeon: Shelly Rubenstein, MD;  Location: [facility];  Service: General;  Laterality: Left;  . KIDNEY STONE SURGERY     lithotripsy  . TONSILLECTOMY      OB History    No data available       Home Medications     Prior to Admission medications   Medication Sig Start Date End Date Taking? Authorizing Provider  acetaminophen (TYLENOL) 500 MG tablet Take 500 mg by mouth every 6 (six) hours as needed. For pain   Yes Historical Provider, MD  amLODipine (NORVASC) 10 MG tablet Take 1 tablet (10 mg total) by mouth daily. *Please call and schedule a one year follow up appointment* 10/19/16  Yes Wendall Stade, MD  colchicine 0.6 MG tablet Take 1 tablet (0.6 mg total) by mouth daily. 10/18/16  Yes Rolland Porter, MD  furosemide (LASIX) 20 MG tablet Take 1/4 tablet by mouth every 3rd day as needed for peripheral edema Patient taking differently: Take 5-20 mg by mouth daily as needed for fluid or edema.  10/12/16  Yes Corwin Levins, MD  HYDROcodone-acetaminophen (NORCO/VICODIN) 5-325 MG tablet Take 1 tablet by mouth every 4 (four) hours as needed. 10/18/16  Yes Rolland Porter, MD  latanoprost (XALATAN) 0.005 % ophthalmic solution Place 1 drop into both eyes at bedtime.    Yes Historical Provider, MD  predniSONE (DELTASONE) 20 MG tablet Take 1 tablet (20 mg total) by mouth 2 (two) times daily with a meal. 10/18/16  Yes Rolland Porter, MD  ranitidine (ZANTAC) 150 MG capsule Take 150 mg by mouth 2 (two) times daily.   Yes Historical Provider, MD  timolol (TIMOPTIC) 0.5 % ophthalmic solution Place 1 drop into both eyes every morning.    Yes Historical Provider, MD    Family History Family History  Problem Relation Age of Onset  . Pancreatic cancer Mother   . COPD Father   . Pancreatic cancer Brother   . Diabetes Brother   . Heart attack Brother   . Colon cancer Neg Hx     Social History Social History  Substance Use Topics  . Smoking status: Former Smoker    Packs/day: 0.25    Types: Cigarettes    Quit date: 11/08/2012  . Smokeless tobacco: Never Used  . Alcohol use No     Allergies   Aspirin; Iohexol; Naproxen; Penicillins; Shellfish allergy; and Dorzolamide hcl-timolol mal pf   Review of Systems Review of Systems   Constitutional: Negative for chills and fever.  Respiratory: Negative for shortness of breath.   Cardiovascular: Negative for chest pain.  Gastrointestinal: Negative for abdominal pain, constipation, diarrhea, nausea and vomiting.  Genitourinary: Negative for dysuria and hematuria.  Musculoskeletal: Positive for arthralgias and joint swelling. Negative for myalgias.  Skin: Positive for color change.  Allergic/Immunologic: Negative for immunocompromised state.  Neurological: Negative for weakness and numbness.  Psychiatric/Behavioral: Negative for confusion.   10 Systems reviewed and are negative for acute change except as noted in the HPI.   Physical Exam Updated Vital Signs BP 137/72   Pulse 62   Temp 97.8 F (36.6 C)   Resp 17   SpO2 94%   Physical Exam  Constitutional: She is oriented to person, place, and time. Vital signs are normal. She  appears well-developed and well-nourished.  Non-toxic appearance. No distress.  Afebrile, nontoxic, NAD  HENT:  Head: Normocephalic and atraumatic.  Mouth/Throat: Mucous membranes are normal.  Eyes: Conjunctivae and EOM are normal. Right eye exhibits no discharge. Left eye exhibits no discharge.  Neck: Normal range of motion. Neck supple.  Cardiovascular: Normal rate and intact distal pulses.   Pulmonary/Chest: Effort normal. No respiratory distress.  Abdominal: Normal appearance. She exhibits no distension.  Musculoskeletal: Normal range of motion.       Left foot: There is tenderness. There is normal range of motion, no swelling, normal capillary refill, no crepitus, no deformity and no laceration.       Feet:  L foot and ankle with FROM intact in all digits and at ankle joint, with mild TTP to L 3rd-5th digits, no tenderness of the foot or ankle, with mild erythema/warmth of 3rd-5th digits but no swelling, no red streaking, no focal joint effusion. No crepitus or deformity. Wiggles all digits with ease. Strength and sensation grossly  intact, distal pulses intact, compartments soft.   Neurological: She is alert and oriented to person, place, and time. She has normal strength. No sensory deficit.  Skin: Skin is warm, dry and intact. No rash noted.  Psychiatric: She has a normal mood and affect. Her behavior is normal.  Nursing note and vitals reviewed.    ED Treatments / Results  Labs (all labs ordered are listed, but only abnormal results are displayed) Labs Reviewed - No data to display  EKG  EKG Interpretation None       Radiology No results found.  Procedures Procedures (including critical care time)  Medications Ordered in ED Medications  dexamethasone (DECADRON) injection 10 mg (10 mg Intramuscular Given 10/20/16 1110)     Initial Impression / Assessment and Plan / ED Course  I have reviewed the triage vital signs and the nursing notes.  Pertinent labs & imaging results that were available during my care of the patient were reviewed by me and considered in my medical decision making (see chart for details).     76 y.o. female here with recurrent gout flare, running out of meds; was seen 10/12/16 by PCP and given depomedrol IM and started on prednisone/colchicine, didn't improve so came to ED 10/18/16 and given decadron IM and restarted on colchicine/pred and norco and states afterwards she improved for about 1 day. This morning her symptoms returned. She can't get an appt with her PCP for another 12 days, and is running low on meds so she came here. On exam, L 3rd-5th toes mildly erythematous, warm, and with mild tenderness, no red streaking, no significant swelling noted, wiggles toes with ease, no tenderness into ankle/foot, NVI with soft compartments. Clinically consistent with gout flare. Will give IM decadron, and extend colchicine/prednisone for another 5-7 days, and refill norco for short supply. Advised ice/elevation, and f/up with PCP's office in 5-7 days for ongoing management. Low purine diet  advised. Doubt septic joint, doubt need for further emergent work up at this time. I explained the diagnosis and have given explicit precautions to return to the ER including for any other new or worsening symptoms. The patient understands and accepts the medical plan as it's been dictated and I have answered their questions. Discharge instructions concerning home care and prescriptions have been given. The patient is STABLE and is discharged to home in good condition.   Final Clinical Impressions(s) / ED Diagnoses   Final diagnoses:  Acute gout of left  foot, unspecified cause    New Prescriptions New Prescriptions   COLCHICINE 0.6 MG TABLET    Take 1 tablet (0.6 mg total) by mouth daily. x7 days or until gout flare resolves   HYDROCODONE-ACETAMINOPHEN (NORCO) 5-325 MG TABLET    Take 1 tablet by mouth every 6 (six) hours as needed for severe pain.   PREDNISONE (DELTASONE) 20 MG TABLET    Take 3 tablets (60 mg total) by mouth daily with breakfast. x5 days. START TAKING TOMORROW 10/21/16     Shyheem Whitham, PA-C 10/20/16 1120    Heide Scales, MD 10/21/16 0100

## 2016-10-20 NOTE — ED Triage Notes (Signed)
Pt reports ongoing gout pain since ED visit on Thursday. Pt says pain medication is not helping enough. Pt does not think she can make it to PCP appointment.

## 2016-10-20 NOTE — Discharge Instructions (Signed)
Your pain is likely from a gout flare. Take prednisone as directed starting tomorrow 10/21/16 since you were already given a steroid shot in the ER today. PLEASE NOTICE THAT THE DOSAGE WAS CHANGED FROM YOUR PRIOR PRESCRIPTION, TAKE AS DIRECTED BASED ON THE NEW PRESCRIPTION however you can still use the old medication that's leftover from your last visit. Take colchicine as directed. Alternate between tylenol and motrin as needed for pain, using hydrocodone as directed as needed for severe pain but don't drive or operate machinery while taking this medication. Ice and elevate your foot to help with pain/swelling. Follow up with your regular doctor in 5-7 days for ongoing management of your gout. Return to the ER for changes or worsening symptoms.

## 2016-10-27 NOTE — Telephone Encounter (Signed)
Delaney Meigsamara, I think this was referred to me instead of another provider while I was gone, or was it already taken care of?  We may need to contact pt if this is not clear. thanks

## 2016-10-29 ENCOUNTER — Other Ambulatory Visit: Payer: Self-pay | Admitting: Internal Medicine

## 2016-10-29 NOTE — Telephone Encounter (Signed)
Routing to dr john, please advise, thanks 

## 2016-10-29 NOTE — Telephone Encounter (Signed)
Per patient, being followed by dr Rolland Portermark james, MD

## 2016-10-29 NOTE — Telephone Encounter (Signed)
Pt called in and needs this refill

## 2016-10-29 NOTE — Telephone Encounter (Signed)
OK to please ask pt if he has been able to try the colchicine, as this was intended for any recurrence of the gout we started tx on feb 2  I see he has been in the ER as well;  If he is just not improved AT ALL with tx so far, this may not be gout, but another form of arthritis  Please try the colchicine if has not tried this, o/w may need Referral to Dr Katrinka BlazingSmith for left ankle arthritis  If he has gotten better and worse, then better and worse, we could consider repeat prednisone and start allopurinol as well unless he is not able to take this

## 2016-10-30 NOTE — Telephone Encounter (Signed)
Called pt she states yes she took the colchicine, but for 6 pills it was $60. She went back to the ER bcz the pain was so severe she could not focus. She states she saw a PA there and they gave her the prednisone, and told her to take the colchicine together and symptoms went away. PA told her to keep prednisone on hand. She states she feels a lot better, but just want a refill to keep on hand. She has a f/u appt w/MD this Thursday as well and can discuss she is afraid that it might flare back up in the middle of the night again...Shelly [name]/lmb

## 2016-10-30 NOTE — Telephone Encounter (Signed)
Notified pt w/MD response.../lmb 

## 2016-10-30 NOTE — Telephone Encounter (Signed)
We normally dont rx prednisone just in case, but ok this time. - done erx

## 2016-11-01 ENCOUNTER — Other Ambulatory Visit (INDEPENDENT_AMBULATORY_CARE_PROVIDER_SITE_OTHER): Payer: Medicare Other

## 2016-11-01 ENCOUNTER — Encounter: Payer: Self-pay | Admitting: Internal Medicine

## 2016-11-01 ENCOUNTER — Ambulatory Visit (INDEPENDENT_AMBULATORY_CARE_PROVIDER_SITE_OTHER): Payer: Medicare Other | Admitting: Internal Medicine

## 2016-11-01 ENCOUNTER — Other Ambulatory Visit: Payer: Self-pay | Admitting: Internal Medicine

## 2016-11-01 VITALS — BP 142/92 | HR 86 | Temp 97.6°F | Ht 65.0 in | Wt 166.0 lb

## 2016-11-01 DIAGNOSIS — R7989 Other specified abnormal findings of blood chemistry: Secondary | ICD-10-CM

## 2016-11-01 DIAGNOSIS — M109 Gout, unspecified: Secondary | ICD-10-CM | POA: Diagnosis not present

## 2016-11-01 DIAGNOSIS — Z0001 Encounter for general adult medical examination with abnormal findings: Secondary | ICD-10-CM | POA: Diagnosis not present

## 2016-11-01 DIAGNOSIS — E785 Hyperlipidemia, unspecified: Secondary | ICD-10-CM | POA: Diagnosis not present

## 2016-11-01 DIAGNOSIS — E039 Hypothyroidism, unspecified: Secondary | ICD-10-CM

## 2016-11-01 DIAGNOSIS — I10 Essential (primary) hypertension: Secondary | ICD-10-CM | POA: Diagnosis not present

## 2016-11-01 LAB — CBC WITH DIFFERENTIAL/PLATELET
Basophils Absolute: 0.1 10*3/uL (ref 0.0–0.1)
Basophils Relative: 0.9 % (ref 0.0–3.0)
EOS PCT: 1.3 % (ref 0.0–5.0)
Eosinophils Absolute: 0.2 10*3/uL (ref 0.0–0.7)
HCT: 43.5 % (ref 36.0–46.0)
Hemoglobin: 14.5 g/dL (ref 12.0–15.0)
LYMPHS ABS: 2.9 10*3/uL (ref 0.7–4.0)
Lymphocytes Relative: 19.7 % (ref 12.0–46.0)
MCHC: 33.3 g/dL (ref 30.0–36.0)
MCV: 87.8 fl (ref 78.0–100.0)
MONOS PCT: 8.6 % (ref 3.0–12.0)
Monocytes Absolute: 1.3 10*3/uL — ABNORMAL HIGH (ref 0.1–1.0)
NEUTROS PCT: 69.5 % (ref 43.0–77.0)
Neutro Abs: 10.3 10*3/uL — ABNORMAL HIGH (ref 1.4–7.7)
Platelets: 212 10*3/uL (ref 150.0–400.0)
RBC: 4.95 Mil/uL (ref 3.87–5.11)
RDW: 14.4 % (ref 11.5–15.5)
WBC: 14.8 10*3/uL — ABNORMAL HIGH (ref 4.0–10.5)

## 2016-11-01 LAB — LDL CHOLESTEROL, DIRECT: LDL DIRECT: 83 mg/dL

## 2016-11-01 LAB — BASIC METABOLIC PANEL
BUN: 22 mg/dL (ref 6–23)
CALCIUM: 9.4 mg/dL (ref 8.4–10.5)
CO2: 31 mEq/L (ref 19–32)
Chloride: 98 mEq/L (ref 96–112)
Creatinine, Ser: 0.93 mg/dL (ref 0.40–1.20)
GFR: 62.69 mL/min (ref >60.00)
GLUCOSE: 80 mg/dL (ref 70–99)
Potassium: 4.4 mEq/L (ref 3.5–5.1)
SODIUM: 134 meq/L — AB (ref 135–145)

## 2016-11-01 LAB — HEPATIC FUNCTION PANEL
ALBUMIN: 3.8 g/dL (ref 3.5–5.2)
ALK PHOS: 79 U/L (ref 39–117)
ALT: 44 U/L — ABNORMAL HIGH (ref 0–35)
AST: 27 U/L (ref 0–37)
BILIRUBIN DIRECT: 0 mg/dL (ref 0.0–0.3)
BILIRUBIN TOTAL: 0.5 mg/dL (ref 0.2–1.2)
Total Protein: 6.2 g/dL (ref 6.0–8.3)

## 2016-11-01 LAB — URINALYSIS, ROUTINE W REFLEX MICROSCOPIC
BILIRUBIN URINE: NEGATIVE
HGB URINE DIPSTICK: NEGATIVE
Ketones, ur: NEGATIVE
LEUKOCYTES UA: NEGATIVE
Nitrite: NEGATIVE
PH: 6 (ref 5.0–8.0)
RBC / HPF: NONE SEEN (ref 0–?)
Specific Gravity, Urine: 1.02 (ref 1.000–1.030)
Total Protein, Urine: NEGATIVE
Urine Glucose: NEGATIVE
Urobilinogen, UA: 0.2 (ref 0.0–1.0)

## 2016-11-01 LAB — LIPID PANEL
CHOL/HDL RATIO: 8
CHOLESTEROL: 356 mg/dL — AB (ref 0–200)
HDL: 42.7 mg/dL (ref >39.00)

## 2016-11-01 LAB — TSH: TSH: 11.31 u[IU]/mL — ABNORMAL HIGH (ref 0.35–4.50)

## 2016-11-01 LAB — URIC ACID: Uric Acid, Serum: 4.6 mg/dL (ref 2.4–7.0)

## 2016-11-01 MED ORDER — AMLODIPINE BESYLATE 10 MG PO TABS: 10.0000 mg | ORAL_TABLET | Freq: Every day | ORAL | 3 refills | Status: DC

## 2016-11-01 MED ORDER — PREDNISONE 20 MG PO TABS: 20.0000 mg | ORAL_TABLET | Freq: Two times a day (BID) | ORAL | 0 refills | Status: DC

## 2016-11-01 MED ORDER — HYDROCODONE-ACETAMINOPHEN 5-325 MG PO TABS: 1.0000 | ORAL_TABLET | ORAL | 0 refills | Status: DC | PRN

## 2016-11-01 MED ORDER — COLCHICINE 0.6 MG PO TABS: 0.6000 mg | ORAL_TABLET | Freq: Every day | ORAL | 11 refills | Status: DC

## 2016-11-01 MED ORDER — LEVOTHYROXINE SODIUM 50 MCG PO TABS: 50.0000 ug | ORAL_TABLET | Freq: Every day | ORAL | 3 refills | Status: DC

## 2016-11-01 NOTE — Patient Instructions (Signed)
Please take all new medication as prescribed - the prednisone and pain medication if needed  Please continue all other medications as before, and refills have been done if requested.  Please have the pharmacy call with any other refills you may need.  Please continue your efforts at being more active, low cholesterol diet, and weight control.  You are otherwise up to date with prevention measures today.  Please keep your appointments with your specialists as you may have planned  Please go to the LAB in the Basement (turn left off the elevator) for the tests to be done today  You will be contacted by phone if any changes need to be made immediately.  Otherwise, you will receive a letter about your results with an explanation, but please check with MyChart first.  Please remember to sign up for MyChart if you have not done so, as this will be important to you in the future with finding out test results, communicating by private email, and scheduling acute appointments online when needed.  If you have Medicare related insurance (such as traditoinal Medicare, [employer] Medicare or [employer] Medicare, or similar), Please make an appointment at the Scheduling desk with Selena BattenKim, the [facility], for your Wellness Visit in this office, which is a benefit with your insurance.  Please return in 1 year for your yearly visit, or sooner if needed, with Lab testing done 3-5 days before

## 2016-11-01 NOTE — Assessment & Plan Note (Addendum)
For uric acid with labs, gave prn  Prednisone x 1 course and liimited hydrocodone prn future attack,  to f/u any worsening symptoms or concerns, declines allopurinol for now  In addition to the time spent performing CPE, I spent an additional 15 minutes face to face,in which greater than 50% of this time was spent in counseling and coordination of care for patient's illness as documented.

## 2016-11-01 NOTE — Assessment & Plan Note (Signed)

## 2016-11-01 NOTE — Progress Notes (Signed)
Subjective:    Patient ID: Shelly [name], female    DOB: 03/30/1941, 76 y.o.   MRN: [id number]  HPI      Here for wellness and f/u;  Overall doing ok;  Pt denies Chest pain, worsening SOB, DOE, wheezing, orthopnea, PND, worsening LE edema, palpitations, dizziness or syncope.  Pt denies neurological change such as new headache, facial or extremity weakness.  Pt denies polydipsia, polyuria, or low sugar symptoms. Pt states overall good compliance with treatment and medications, good tolerability, and has been trying to follow appropriate diet.  Pt denies worsening depressive symptoms, suicidal ideation or panic. No fever, night sweats, wt loss, loss of appetite, or other constitutional symptoms.  Pt states good ability with ADL's, has low fall risk, home safety reviewed and adequate, no other significant changes in hearing or vision, and only occasionally active with exercise.  Never took the fenofibrate for elev TG after last labs may 2016 .  Out of amlodipine 10 qd, needs refill for elevated BP.  Declines flu shot and pneumovax and DXA  Here to f/u after recent gout flare very diffcult to control with anti-inflammatory, seen 3 times since feb 2, finally required pred 60 and colchicine to finally improve, now with a refill penidng prednisone but has not taken.  Asks for colchicine refill though is expensive.  Asks for hydrocodone limited rx for pain (does not abuse or divert).  Lost 7 lbs with dieting b/c afraid to eat to cuase another flare.  Not taking lasix currently.  Has not yet had uric acid level.  Declines trial of allopurinol for now.  Past Medical History:  Diagnosis Date  . Anxiety   . Arthritis   . Diverticulosis 04/21/2007  . Gastritis 04/21/2007  . GERD (gastroesophageal reflux disease)   . Glaucoma   . Hemorrhoids   . Hiatal hernia 04/21/2007  . History of angioedema   . History of kidney stones   . History of pneumonia   . HLD (hyperlipidemia)   . HTN (hypertension)    stress  .  IBS (irritable bowel syndrome)    Past Surgical History:  Procedure Laterality Date  . APPENDECTOMY     age 76  . BLADDER REPAIR     repair left ureter- "wrapped around kidney"  . CHOLECYSTECTOMY    . INGUINAL HERNIA REPAIR  06/06/2012   Procedure: HERNIA REPAIR INGUINAL ADULT;  Surgeon: Shelly Rubensteinouglas A Blackman, MD;  Location: [facility];  Service: General;  Laterality: Left;  . KIDNEY STONE SURGERY     lithotripsy  . TONSILLECTOMY      reports that she quit smoking about 74 years ago. Her smoking use included Cigarettes. She smoked 0.25 packs per day. She has never used smokeless tobacco. She reports that she does not drink alcohol or use drugs. family history includes COPD in her father; Diabetes in her brother; Heart attack in her brother; Pancreatic cancer in her brother and mother. Allergies  Allergen Reactions  . Aspirin Hives    Can take low dose aspirin  . Iohexol      Code: HIVES, Desc: PT had IVP in 1992- developed severe hives several hours post injection., Onset Date: [id number]   . Naproxen Swelling    Can take alieve in low doses  . Penicillins Other (See Comments)    Unknown childhood allergy - can take amoxicillin  . Shellfish Allergy Hives  . Dorzolamide Hcl-Timolol Mal Pf Rash    Frequency urrination   Current Outpatient Prescriptions on  File Prior to Visit  Medication Sig Dispense Refill  . acetaminophen (TYLENOL) 500 MG tablet Take 500 mg by mouth every 6 (six) hours as needed. For pain    . furosemide (LASIX) 20 MG tablet Take 1/4 tablet by mouth every 3rd day as needed for peripheral edema (Patient taking differently: Take 5-20 mg by mouth daily as needed for fluid or edema. ) 30 tablet 5  . latanoprost (XALATAN) 0.005 % ophthalmic solution Place 1 drop into both eyes at bedtime.     . ranitidine (ZANTAC) 150 MG capsule Take 150 mg by mouth 2 (two) times daily.    . timolol (TIMOPTIC) 0.5 % ophthalmic solution Place 1 drop into both eyes every morning.      No current  facility-administered medications on file prior to visit.    Review of Systems Constitutional: Negative for increased diaphoresis, or other activity, appetite or siginficant weight change other than noted HENT: Negative for worsening hearing loss, ear pain, facial swelling, mouth sores and neck stiffness.   Eyes: Negative for other worsening pain, redness or visual disturbance.  Respiratory: Negative for choking or stridor Cardiovascular: Negative for other chest pain and palpitations.  Gastrointestinal: Negative for worsening diarrhea, blood in stool, or abdominal distention Genitourinary: Negative for hematuria, flank pain or change in urine volume.  Musculoskeletal: Negative for myalgias or other joint complaints.  Skin: Negative for other color change and wound or drainage.  Neurological: Negative for syncope and numbness. other than noted Hematological: Negative for adenopathy. or other swelling Psychiatric/Behavioral: Negative for hallucinations, SI, self-injury, decreased concentration or other worsening agitation.  All other system neg per pt    Objective:   Physical Exam BP (!) 142/92   Pulse 86   Temp 97.6 F (36.4 C)   Ht 5\' 5"  (1.651 m)   Wt 166 lb (75.3 kg)   SpO2 98%   BMI 27.62 kg/m  VS noted,  Constitutional: Pt is oriented to person, place, and time. Appears well-developed and well-nourished, in no significant distress Head: Normocephalic and atraumatic  Eyes: Conjunctivae and EOM are normal. Pupils are equal, round, and reactive to light Right Ear: External ear normal.  Left Ear: External ear normal Nose: Nose normal.  Mouth/Throat: Oropharynx is clear and moist  Neck: Normal range of motion. Neck supple. No JVD present. No tracheal deviation present or significant neck LA or mass Cardiovascular: Normal rate, regular rhythm, normal heart sounds and intact distal pulses.   Pulmonary/Chest: Effort normal and breath sounds without rales or wheezing  Abdominal:  Soft. Bowel sounds are normal. NT. No HSM  Musculoskeletal: Normal range of motion. Exhibits no edema Lymphadenopathy: Has no cervical adenopathy.  Neurological: Pt is alert and oriented to person, place, and time. Pt has normal reflexes. No cranial nerve deficit. Motor grossly intact Skin: Skin is warm and dry. No rash noted or new ulcers Psychiatric:  Has normal mood and affect. Behavior is normal.  No other new exam findings, no joint effusions    Assessment & Plan:

## 2016-11-04 NOTE — Assessment & Plan Note (Signed)
Lab Results  Component Value Date   CHOL 356 (H) 11/01/2016   CHOL 320 (H) 01/27/2015   CHOL 280 (H) 01/02/2011   Lab Results  Component Value Date   HDL 42.70 11/01/2016   HDL 39.80 01/27/2015   HDL 46.80 01/02/2011   No results found for: [facility] Lab Results  Component Value Date   TRIG (H) 11/01/2016    1039.0 Triglyceride is over 400; calculations on Lipids are invalid.   TRIG (H) 01/27/2015    534.0 Triglyceride is over 400; calculations on Lipids are invalid.   TRIG 151.0 (H) 01/02/2011   Lab Results  Component Value Date   CHOLHDL 8 11/01/2016   CHOLHDL 8 01/27/2015   CHOLHDL 6 01/02/2011   Lab Results  Component Value Date   LDLDIRECT 83.0 11/01/2016   LDLDIRECT 89.0 01/27/2015   LDLDIRECT 182.5 01/02/2011  stable overall by history and exam, recent data reviewed with pt, and pt to continue medical treatment as before,  to f/u any worsening symptoms or concerns

## 2016-11-04 NOTE — Assessment & Plan Note (Signed)
stable overall by history and exam, recent data reviewed with pt, and pt to continue medical treatment as before,  to f/u any worsening symptoms or concerns BP Readings from Last 3 Encounters:  11/01/16 (!) 142/92  10/20/16 146/77  10/12/16 138/76

## 2016-11-07 ENCOUNTER — Ambulatory Visit: Payer: Medicare Other | Admitting: Internal Medicine

## 2016-11-26 ENCOUNTER — Telehealth: Payer: Self-pay | Admitting: Internal Medicine

## 2016-11-26 DIAGNOSIS — M25572 Pain in left ankle and joints of left foot: Secondary | ICD-10-CM

## 2016-11-26 NOTE — Telephone Encounter (Signed)
Routing to dr Yetta Barrejones, please advise in dr Melvyn Novasjohns absence---patient would like to come for xray today, thanks

## 2016-11-26 NOTE — Telephone Encounter (Signed)
Pt called and states the medicine Clohicine, gave her mouth blisters, nausea and chest pain. She has stopped taking it.   Pt believes she does not have gout but she has a chip in her left foot causing her pain. She would like an Xray done to her left foot.  States she does not want to come in unless she has to. Would like an Xray ordered for her. She has taken predizone for the pain and it helped a little.  Please call back.

## 2016-11-26 NOTE — Telephone Encounter (Signed)
I am not comfortable ordering an xray on someone I have not seen

## 2016-11-27 NOTE — Telephone Encounter (Signed)
Left message advising that xray order is in, gave xray lab hours, can come at patient's earliest convenience

## 2016-11-27 NOTE — Telephone Encounter (Signed)
Ok for films to r/o fx as pt has not gotten much better with gout tx

## 2016-11-27 NOTE — Telephone Encounter (Signed)
Routing to dr john, please advise, I will call patient back 

## 2016-11-28 ENCOUNTER — Other Ambulatory Visit: Payer: Self-pay | Admitting: Internal Medicine

## 2016-11-28 NOTE — Telephone Encounter (Signed)
Done erx - prednisone

## 2016-11-29 ENCOUNTER — Ambulatory Visit (INDEPENDENT_AMBULATORY_CARE_PROVIDER_SITE_OTHER)
Admission: RE | Admit: 2016-11-29 | Discharge: 2016-11-29 | Disposition: A | Discharge status: Home / Self Care | Payer: Medicare Other | Source: Ambulatory Visit | Attending: Internal Medicine | Admitting: Internal Medicine

## 2016-11-29 DIAGNOSIS — M25572 Pain in left ankle and joints of left foot: Secondary | ICD-10-CM | POA: Diagnosis not present

## 2016-11-29 DIAGNOSIS — M2012 Hallux valgus (acquired), left foot: Secondary | ICD-10-CM | POA: Diagnosis not present

## 2016-11-30 ENCOUNTER — Telehealth: Payer: Self-pay | Admitting: Internal Medicine

## 2016-11-30 ENCOUNTER — Encounter: Payer: Self-pay | Admitting: Internal Medicine

## 2016-11-30 NOTE — Telephone Encounter (Signed)
Contacted pt and informed would send to PCP but may need an appointment.

## 2016-11-30 NOTE — Telephone Encounter (Signed)
Please make appt for Sat clinic to be seen in the AM, or go to UC for fever or worsening pain today

## 2016-11-30 NOTE — Telephone Encounter (Signed)
We are very sorry the patient feels this way,  Office policy however, is to require evaluation in the event any antibiotic for any reason is prescribed, in order to not overprescribe antibiotics, and lessen the chance of worsening resistant bacteria that is happening every day.  I no longer feel comfortable with serving as PCP for this patient.  Pt is to be dismissed

## 2016-11-30 NOTE — Telephone Encounter (Signed)
Letter done  Needs to have the dismissal form added as well

## 2016-11-30 NOTE — Telephone Encounter (Signed)
Pt called and said that she called the pharmacy for a refill on prednisone. She said that she now thinks that there is an infection in her toe instead of gout. She is wondering if a prescription of amoxicillin would help. Please advise. Thanks [employer]

## 2016-11-30 NOTE — Telephone Encounter (Signed)
Patient called back.  She is requesting call back.

## 2016-11-30 NOTE — Telephone Encounter (Signed)
I called pt to give her orders from Dr John. She Jonny Ruizwas very upset with this and said that she is not coming in tomorrow and is not going to the UC. She said that she doesn't understand why she can not have a Z-pak to help get rid of the inflammation. She said that she was never notified about her x ray so I told her what it said. She was upset that it still does not explain the issue with her third toe. She also expressed her aggravation in not getting calls regarding results. She mentioned a time that she had labs done and said that the pharmacy had to tell her about them. She said that Dr Jonny RuizJohn is a very nice person and enjoys talking to him but does not understand why he can not "man up" and give results to her when he receives them. She stated more than once that the only issue she is having is with her third toe. She said that a Z-pak would help it and wants a prescription for it. I told her I would send over a message regarding the issue and if needed I would call her back.

## 2016-11-30 NOTE — Telephone Encounter (Signed)
Can you call patient?

## 2016-12-03 NOTE — Telephone Encounter (Signed)
We do not need to call the patient at this point. She will receive the dismissal letter and we do not need to remove provider at this point. Dr. Jonny RuizJohn will continue to provide care to the pt for another 33 days after delivery of the letter.

## 2016-12-03 NOTE — Telephone Encounter (Signed)
Have you called this patient yet?   Sidenote; I need you to show me how to remove PCP again.

## 2016-12-03 NOTE — Telephone Encounter (Signed)
Patient called this morning. Going on and on about how she needed to see another one of our female doctors because Dr. Jonny RuizJohn did not know what he was doing and did not ever call her on the phone to talk with her about her results. And how she believes a female doctor needs to go over any and every results she has had done. I spoke with patient and informed of some choices. She did not like anything I was offering. She started to get loud and such on the phone. I had to inform her I was going to hang up and not continue with her yelling and such.

## 2016-12-03 NOTE — Telephone Encounter (Signed)
I am still ok with the dismissal.    Please be aware from a legal standpoint that we are only obligated for urgent care for the patient for 30 days from the date of the letter (not 33 days after recept) because I specified this in the letter.  There is no legal requirement to provide care for 33 days, this is only a prior convention that does not necessarily need to be followed, to the best of my knowledge.   I would not recommend this pt to be established in the next days with new PCP in the office, as dismissal action has already been initiated.

## 2016-12-03 NOTE — Telephone Encounter (Signed)
Forwarding to BoswellJulie and Dr. Jonny RuizJohn.

## 2016-12-05 ENCOUNTER — Telehealth: Payer: Self-pay | Admitting: Internal Medicine

## 2016-12-05 NOTE — Telephone Encounter (Signed)
Patient dismissed from [facility] by Oliver BarreJames John MD , effective November 30, 2016. Dismissal letter sent out by certified / registered mail.  DAJ

## 2016-12-17 NOTE — Telephone Encounter (Signed)
Received signed domestic return receipt verifying delivery of certified letter on December 13, 2016. Article number [id number]

## 2016-12-20 DIAGNOSIS — H2513 Age-related nuclear cataract, bilateral: Secondary | ICD-10-CM | POA: Diagnosis not present

## 2016-12-20 DIAGNOSIS — H40033 Anatomical narrow angle, bilateral: Secondary | ICD-10-CM | POA: Diagnosis not present

## 2017-01-09 ENCOUNTER — Other Ambulatory Visit: Payer: Self-pay | Admitting: Obstetrics

## 2017-01-09 DIAGNOSIS — Z1231 Encounter for screening mammogram for malignant neoplasm of breast: Secondary | ICD-10-CM

## 2017-01-10 ENCOUNTER — Ambulatory Visit: Payer: Medicare Other

## 2017-01-10 ENCOUNTER — Other Ambulatory Visit (INDEPENDENT_AMBULATORY_CARE_PROVIDER_SITE_OTHER): Payer: Medicare Other

## 2017-01-10 ENCOUNTER — Encounter: Payer: Self-pay | Admitting: Internal Medicine

## 2017-01-10 DIAGNOSIS — E039 Hypothyroidism, unspecified: Secondary | ICD-10-CM

## 2017-01-10 LAB — TSH: TSH: 2.62 u[IU]/mL (ref 0.35–4.50)

## 2017-01-10 LAB — T4, FREE: FREE T4: 0.73 ng/dL (ref 0.60–1.60)

## 2017-01-28 ENCOUNTER — Institutional Professional Consult (permissible substitution): Payer: Medicare Other | Admitting: Pulmonary Disease

## 2017-02-01 ENCOUNTER — Ambulatory Visit: Payer: Medicare Other

## 2017-02-22 ENCOUNTER — Ambulatory Visit: Payer: Medicare Other

## 2017-02-28 ENCOUNTER — Institutional Professional Consult (permissible substitution): Payer: Medicare Other | Admitting: Pulmonary Disease

## 2017-03-08 ENCOUNTER — Ambulatory Visit: Payer: Medicare Other

## 2017-03-23 ENCOUNTER — Emergency Department (HOSPITAL_COMMUNITY)
Admission: EM | Admit: 2017-03-23 | Discharge: 2017-03-23 | Disposition: A | Discharge status: Home / Self Care | Payer: Medicare Other | Attending: Emergency Medicine | Admitting: Emergency Medicine

## 2017-03-23 ENCOUNTER — Encounter (HOSPITAL_COMMUNITY): Payer: Self-pay | Admitting: Emergency Medicine

## 2017-03-23 DIAGNOSIS — Y999 Unspecified external cause status: Secondary | ICD-10-CM | POA: Insufficient documentation

## 2017-03-23 DIAGNOSIS — I10 Essential (primary) hypertension: Secondary | ICD-10-CM | POA: Diagnosis not present

## 2017-03-23 DIAGNOSIS — Z23 Encounter for immunization: Secondary | ICD-10-CM | POA: Diagnosis not present

## 2017-03-23 DIAGNOSIS — Z87891 Personal history of nicotine dependence: Secondary | ICD-10-CM | POA: Insufficient documentation

## 2017-03-23 DIAGNOSIS — S6991XA Unspecified injury of right wrist, hand and finger(s), initial encounter: Secondary | ICD-10-CM | POA: Diagnosis present

## 2017-03-23 DIAGNOSIS — Z79899 Other long term (current) drug therapy: Secondary | ICD-10-CM | POA: Insufficient documentation

## 2017-03-23 DIAGNOSIS — W268XXA Contact with other sharp object(s), not elsewhere classified, initial encounter: Secondary | ICD-10-CM | POA: Insufficient documentation

## 2017-03-23 DIAGNOSIS — S61411A Laceration without foreign body of right hand, initial encounter: Secondary | ICD-10-CM | POA: Diagnosis not present

## 2017-03-23 DIAGNOSIS — Y939 Activity, unspecified: Secondary | ICD-10-CM | POA: Insufficient documentation

## 2017-03-23 DIAGNOSIS — Y9279 Other farm location as the place of occurrence of the external cause: Secondary | ICD-10-CM | POA: Insufficient documentation

## 2017-03-23 DIAGNOSIS — Z5189 Encounter for other specified aftercare: Secondary | ICD-10-CM

## 2017-03-23 DIAGNOSIS — Z4800 Encounter for change or removal of nonsurgical wound dressing: Secondary | ICD-10-CM | POA: Diagnosis not present

## 2017-03-23 MED ORDER — TETANUS-DIPHTH-ACELL PERTUSSIS 5-2.5-18.5 LF-MCG/0.5 IM SUSP: 0.5000 mL | Freq: Once | INTRAMUSCULAR | Status: DC

## 2017-03-23 MED ORDER — TETANUS-DIPHTH-ACELL PERTUSSIS 5-2.5-18.5 LF-MCG/0.5 IM SUSP
0.5000 mL | Freq: Once | INTRAMUSCULAR | Status: AC
  Administered 2017-03-23: 0.5 mL via INTRAMUSCULAR
  Filled 2017-03-23: qty 0.5

## 2017-03-23 NOTE — ED Triage Notes (Signed)
Pt reports she cut her finger on her R hand on Thursday in an old building. Wound is healing up well, but pt wanted to get a tetanus shot since she is out of date.

## 2017-03-23 NOTE — ED Provider Notes (Signed)
[facility]-EMERGENCY DEPT Provider Note   CSN: [id number] Arrival date & time: 03/23/17  1415  By signing my name below, I, Vista Minkobert Ross, attest that this documentation has been prepared under the direction and in the presence of YahooKelly Doyce Saling PA-C.  Electronically Signed: Vista Minkobert Ross, ED Scribe. 03/23/17. 2:53 PM.  History   Chief Complaint Chief Complaint  Patient presents with  . tetanus shot    HPI HPI Comments: Shelly [name] is a 76 y.o. female who presents to the Emergency Department complaining of a laceration to her right hand that occurred two days ago. Pt cut her right hand on [place]. She has a small wound from this incident which she has cleaned with peroxide and applied neosporin. It has been healing well. She has some pain and redness over the are but she believes she may have exacerbated it by continuing to use the hand. She presents here today in concern for Tetanus and would like an updated TDAP because hers is not UTD. She thinks it has been more than 10 years. No other concerns today.  The history is provided by the patient. No language interpreter was used.    Past Medical History:  Diagnosis Date  . Anxiety   . Arthritis   . Diverticulosis 04/21/2007  . Gastritis 04/21/2007  . GERD (gastroesophageal reflux disease)   . Glaucoma   . Hemorrhoids   . Hiatal hernia 04/21/2007  . History of angioedema   . History of kidney stones   . History of pneumonia   . HLD (hyperlipidemia)   . HTN (hypertension)    stress  . IBS (irritable bowel syndrome)     Patient Active Problem List   Diagnosis Date Noted  . Acute gout 10/12/2016  . Encounter for well adult exam with abnormal findings 01/27/2015  . IBS (irritable bowel syndrome)   . GERD (gastroesophageal reflux disease)   . History of kidney stones   . HTN (hypertension)   . HLD (hyperlipidemia)   . Arthritis   . Glaucoma   . Anxiety   . Left inguinal hernia 05/14/2012  .  CAROTID ARTERY DISEASE 11/24/2010  . PULMONARY NODULE, LEFT LOWER LOBE 06/05/2008  . DIVERTICULOSIS OF COLON 06/05/2008  . ANGIOEDEMA 06/05/2008  . Personal history of other diseases of digestive system 06/05/2008  . NEPHROLITHIASIS, HX OF 06/05/2008  . Cough 06/04/2008  . Diverticulosis 04/21/2007    Past Surgical History:  Procedure Laterality Date  . APPENDECTOMY     age 76  . BLADDER REPAIR     repair left ureter- "wrapped around kidney"  . CHOLECYSTECTOMY    . INGUINAL HERNIA REPAIR  06/06/2012   Procedure: HERNIA REPAIR INGUINAL ADULT;  Surgeon: Shelly Rubensteinouglas A Blackman, MD;  Location: [facility]S;  Service: General;  Laterality: Left;  . KIDNEY STONE SURGERY     lithotripsy  . TONSILLECTOMY      OB History    No data available       Home Medications    Prior to Admission medications   Medication Sig Start Date End Date Taking? Authorizing Provider  acetaminophen (TYLENOL) 500 MG tablet Take 500 mg by mouth every 6 (six) hours as needed. For pain    [provider]  amLODipine (NORVASC) 10 MG tablet Take 1 tablet (10 mg total) by mouth daily. 11/01/16   Corwin LevinsJohn, James W, MD  colchicine 0.6 MG tablet Take 1 tablet (0.6 mg total) by mouth daily. 11/01/16  Corwin Levins, MD  furosemide (LASIX) 20 MG tablet Take 1/4 tablet by mouth every 3rd day as needed for peripheral edema Patient taking differently: Take 5-20 mg by mouth daily as needed for fluid or edema.  10/12/16   Corwin Levins, MD  HYDROcodone-acetaminophen (NORCO/VICODIN) 5-325 MG tablet Take 1 tablet by mouth every 4 (four) hours as needed. 11/01/16   Corwin Levins, MD  latanoprost (XALATAN) 0.005 % ophthalmic solution Place 1 drop into both eyes at bedtime.     [provider]  levothyroxine (SYNTHROID, LEVOTHROID) 50 MCG tablet Take 1 tablet (50 mcg total) by mouth daily. 11/01/16   Corwin Levins, MD  predniSONE (DELTASONE) 20 MG tablet TAKE 1 TABLET (20 MG TOTAL) BY MOUTH 2 (TWO) TIMES DAILY WITH A MEAL.  11/28/16   Corwin Levins, MD  ranitidine (ZANTAC) 150 MG capsule Take 150 mg by mouth 2 (two) times daily.    [provider]  timolol (TIMOPTIC) 0.5 % ophthalmic solution Place 1 drop into both eyes every morning.     [provider]    Family History Family History  Problem Relation Age of Onset  . Pancreatic cancer Mother   . COPD Father   . Pancreatic cancer Brother   . Diabetes Brother   . Heart attack Brother   . Colon cancer Neg Hx     Social History Social History  Substance Use Topics  . Smoking status: Former Smoker    Packs/day: 0.25    Types: Cigarettes    Quit date: 11/08/2012  . Smokeless tobacco: Never Used  . Alcohol use No     Allergies   Aspirin; Iohexol; Naproxen; Penicillins; Shellfish allergy; and Dorzolamide hcl-timolol mal   Review of Systems Review of Systems  Constitutional: Negative for fever.  Skin: Positive for wound (R hand). Negative for color change.     Physical Exam Updated Vital Signs BP (!) 153/78 (BP Location: Left Arm)   Pulse 85   Temp 98 F (36.7 C)   Resp 16   Ht 5\' 5"  (1.651 m)   Wt 160 lb (72.6 kg)   SpO2 95%   BMI 26.63 kg/m   Physical Exam  Constitutional: She is oriented to person, place, and time. She appears well-developed and well-nourished. No distress.  Pleasant, conversant  HENT:  Head: Normocephalic and atraumatic.  Eyes: Pupils are equal, round, and reactive to light. Conjunctivae are normal. Right eye exhibits no discharge. Left eye exhibits no discharge. No scleral icterus.  Neck: Normal range of motion.  Cardiovascular: Normal rate.   Pulmonary/Chest: Effort normal. No respiratory distress.  Abdominal: She exhibits no distension.  Musculoskeletal:  ~3cm wound healing by secondary intention over right lateral index finger. Appear to be healing well without signs of infection. FROM of all fingers, NV intact.  Neurological: She is alert and oriented to person, place, and time.  Skin:  Skin is warm and dry. She is not diaphoretic.  Psychiatric: She has a normal mood and affect. Her behavior is normal. Judgment normal.  Nursing note and vitals reviewed.    ED Treatments / Results  DIAGNOSTIC STUDIES: Oxygen Saturation is 95% on RA, normal by my interpretation.  COORDINATION OF CARE: 2:49 PM-Discussed treatment plan with pt at bedside and pt agreed to plan.   Labs (all labs ordered are listed, but only abnormal results are displayed) Labs Reviewed - No data to display  EKG  EKG Interpretation None       Radiology  No results found.  Procedures Procedures (including critical care time)  Medications Ordered in ED Medications  Tdap (BOOSTRIX) injection 0.5 mL (0.5 mLs Intramuscular Given 03/23/17 1520)     Initial Impression / Assessment and Plan / ED Course  I have reviewed the triage vital signs and the nursing notes.  Pertinent labs & imaging results that were available during my care of the patient were reviewed by me and considered in my medical decision making (see chart for details).  76 year old female presents for wound check and tetanus update after a laceration several days ago. Wound appears to be healing well. Advised continuing wound care. Tdap was given. Return precautions were given.  Final Clinical Impressions(s) / ED Diagnoses   Final diagnoses:  Encounter for post-traumatic wound check  Need for tetanus, diphtheria, and acellular pertussis (Tdap) vaccine    New Prescriptions New Prescriptions   No medications on file   I personally performed the services described in this documentation, which was scribed in my presence. The recorded information has been reviewed and is accurate.     Bethel Born, PA-C 03/23/17 Rufina Falco    Arby Barrette, MD 03/28/17 [phone]

## 2017-04-11 ENCOUNTER — Institutional Professional Consult (permissible substitution): Payer: Medicare Other | Admitting: Pulmonary Disease

## 2017-06-27 ENCOUNTER — Institutional Professional Consult (permissible substitution): Payer: Medicare Other | Admitting: Pulmonary Disease

## 2017-07-04 DIAGNOSIS — H401131 Primary open-angle glaucoma, bilateral, mild stage: Secondary | ICD-10-CM | POA: Diagnosis not present

## 2017-07-15 ENCOUNTER — Encounter: Payer: Self-pay | Admitting: Pulmonary Disease

## 2017-07-15 ENCOUNTER — Ambulatory Visit (INDEPENDENT_AMBULATORY_CARE_PROVIDER_SITE_OTHER)
Admission: RE | Admit: 2017-07-15 | Discharge: 2017-07-15 | Disposition: A | Discharge status: Home / Self Care | Payer: Medicare Other | Source: Ambulatory Visit | Attending: Pulmonary Disease | Admitting: Pulmonary Disease

## 2017-07-15 ENCOUNTER — Ambulatory Visit: Payer: Medicare Other | Admitting: Pulmonary Disease

## 2017-07-15 VITALS — BP 128/76 | HR 69 | Ht 64.0 in | Wt 170.6 lb

## 2017-07-15 DIAGNOSIS — Z87891 Personal history of nicotine dependence: Secondary | ICD-10-CM

## 2017-07-15 DIAGNOSIS — R911 Solitary pulmonary nodule: Secondary | ICD-10-CM

## 2017-07-15 NOTE — Progress Notes (Signed)
Subjective:    Patient ID: Shelly [name], female    DOB: 03/09/1941, 76 y.o.   MRN: [id number]  Synopsis: Referred in 2018 for pulmonary nodule. She had first heard that she had this in 2008.  HPI Chief Complaint  Patient presents with  . Advice Only    Self referral for continuation of care   "Shelly [name]" is here to see me today to establish care with a pulmonologist.  She has a history of pneumonia > this has occurred several times > always shows up in the left lung > She had some dyspnea a few years ago while at a conference and was told she had a "spot" on her left lung which was an infection > She has not had a CXR recently > no recent pneumonia > she was never hospitalized for pneumonia > she was always treated as an outpatient  She started smoking age 76 and quit age 76.   > she smoked 1/2 to 1 Pack per day   She has never been told that she had asthma or COPD.  She denies dyspnea or cough.    She turns down the flu and pneumonia vaccines.     Past Medical History:  Diagnosis Date  . Anxiety   . Arthritis   . Diverticulosis 04/21/2007  . Gastritis 04/21/2007  . GERD (gastroesophageal reflux disease)   . Glaucoma   . Hemorrhoids   . Hiatal hernia 04/21/2007  . History of angioedema   . History of kidney stones   . History of pneumonia   . HLD (hyperlipidemia)   . HTN (hypertension)    stress  . IBS (irritable bowel syndrome)      Family History  Problem Relation Age of Onset  . Pancreatic cancer Mother   . COPD Father   . Pancreatic cancer Brother   . Diabetes Brother   . Heart attack Brother   . Colon cancer Neg Hx      Social History   Socioeconomic History  . Marital status: Married    Spouse name: Not on file  . Number of children: 2  . Years of education: Not on file  . Highest education level: Not on file  Social Needs  . Financial resource strain: Not on file  . Food insecurity - worry: Not on file  . Food insecurity - inability: Not on  file  . Transportation needs - medical: Not on file  . Transportation needs - non-medical: Not on file  Occupational History  . Occupation: retired    Associate Professormployer: RETIRED  Tobacco Use  . Smoking status: Former Smoker    Packs/day: 0.25    Types: Cigarettes    Last attempt to quit: 11/08/2012    Years since quitting: 4.6  . Smokeless tobacco: Never Used  Substance and Sexual Activity  . Alcohol use: No    Alcohol/week: 0.0 oz  . Drug use: No  . Sexual activity: Not on file  Other Topics Concern  . Not on file  Social History Narrative  . Not on file     Allergies  Allergen Reactions  . Aspirin Hives    Can take low dose aspirin  . Iohexol      Code: HIVES, Desc: PT had IVP in 1992- developed severe hives several hours post injection., Onset Date: [id number]   . Naproxen Swelling    Can take alieve in low doses  . Penicillins Other (See Comments)    Unknown childhood allergy - can  take amoxicillin  . Shellfish Allergy Hives  . Dorzolamide Hcl-Timolol Mal Rash    Frequency urrination     Outpatient Medications Prior to Visit  Medication Sig Dispense Refill  . acetaminophen (TYLENOL) 500 MG tablet Take 500 mg by mouth every 6 (six) hours as needed. For pain    . amLODipine (NORVASC) 10 MG tablet Take 1 tablet (10 mg total) by mouth daily. 90 tablet 3  . furosemide (LASIX) 20 MG tablet Take 1/4 tablet by mouth every 3rd day as needed for peripheral edema (Patient taking differently: Take 5-20 mg by mouth daily as needed for fluid or edema. ) 30 tablet 5  . latanoprost (XALATAN) 0.005 % ophthalmic solution Place 1 drop into both eyes at bedtime.     Marland Kitchen levothyroxine (SYNTHROID, LEVOTHROID) 50 MCG tablet Take 1 tablet (50 mcg total) by mouth daily. 90 tablet 3  . ranitidine (ZANTAC) 150 MG capsule Take 150 mg daily as needed by mouth.     . timolol (TIMOPTIC) 0.5 % ophthalmic solution Place 1 drop into both eyes every morning.     . colchicine 0.6 MG tablet Take 1 tablet (0.6 mg  total) by mouth daily. (Patient not taking: Reported on 07/15/2017) 30 tablet 11  . HYDROcodone-acetaminophen (NORCO/VICODIN) 5-325 MG tablet Take 1 tablet by mouth every 4 (four) hours as needed. (Patient not taking: Reported on 07/15/2017) 10 tablet 0  . predniSONE (DELTASONE) 20 MG tablet TAKE 1 TABLET (20 MG TOTAL) BY MOUTH 2 (TWO) TIMES DAILY WITH A MEAL. (Patient not taking: Reported on 07/15/2017) 10 tablet 0   No facility-administered medications prior to visit.       Review of Systems  Constitutional: Negative for fever and unexpected weight change.  HENT: Negative for congestion, dental problem, ear pain, nosebleeds, postnasal drip, rhinorrhea, sinus pressure, sneezing, sore throat and trouble swallowing.   Eyes: Negative for redness and itching.  Respiratory: Negative for cough, chest tightness, shortness of breath and wheezing.   Cardiovascular: Negative for palpitations and leg swelling.  Gastrointestinal: Negative for nausea and vomiting.  Genitourinary: Negative for dysuria.  Musculoskeletal: Negative for joint swelling.  Skin: Negative for rash.  Neurological: Negative for headaches.  Hematological: Does not bruise/bleed easily.  Psychiatric/Behavioral: Negative for dysphoric mood. The patient is not nervous/anxious.        Objective:   Physical Exam Vitals:   07/15/17 1059  BP: 128/76  Pulse: 69  SpO2: 99%  Weight: 170 lb 9.6 oz (77.4 kg)  Height: 5\' 4"  (1.626 m)   RA  Gen: well appearing, no acute distress HENT: NCAT, OP clear, neck supple without masses Eyes: PERRL, EOMi Lymph: no cervical lymphadenopathy PULM: CTA B CV: RRR, no mgr, no JVD GI: BS+, soft, nontender, no hsm Derm: no rash or skin breakdown MSK: normal bulk and tone Neuro: A&Ox4, CN II-XII intact, strength 5/5 in all 4 extremities Psyche: normal mood and affect   2008 CT chest images independently reviewed showing what appears to be a groundglass nodule in the left lower lobe 2013 chest  x-ray images independently reviewed showing a left lower lobe nodule       Assessment & Plan:   Pulmonary nodule, left - Plan: DG Chest 2 View  Ex-cigarette smoker  Discussion: Ms. Sayer is a pleasant 76 year old female who smoked for about 28 years and quit 6 years ago.  She has had a history of a left lower lobe nodule, this appears to have been a groundglass nodule based on the CT  scan of her chest in 2008.  Worrisomely, this was still present chest x-ray performed about 5 years ago.  Best approach moving forward is to repeat a chest x-ray today, if there is still an abnormality there then we will need to get another CT scan of the chest.  Because of her smoking history she is at high risk for lung cancer.  Plan: Pulmonary nodule: We will check a Chest X-ray and call you with the results Depending on the result of the Chest X-ray, we will determine when you need to follow-up     Current Outpatient Medications:  .  acetaminophen (TYLENOL) 500 MG tablet, Take 500 mg by mouth every 6 (six) hours as needed. For pain, Disp: , Rfl:  .  amLODipine (NORVASC) 10 MG tablet, Take 1 tablet (10 mg total) by mouth daily., Disp: 90 tablet, Rfl: 3 .  furosemide (LASIX) 20 MG tablet, Take 1/4 tablet by mouth every 3rd day as needed for peripheral edema (Patient taking differently: Take 5-20 mg by mouth daily as needed for fluid or edema. ), Disp: 30 tablet, Rfl: 5 .  latanoprost (XALATAN) 0.005 % ophthalmic solution, Place 1 drop into both eyes at bedtime. , Disp: , Rfl:  .  levothyroxine (SYNTHROID, LEVOTHROID) 50 MCG tablet, Take 1 tablet (50 mcg total) by mouth daily., Disp: 90 tablet, Rfl: 3 .  ranitidine (ZANTAC) 150 MG capsule, Take 150 mg daily as needed by mouth. , Disp: , Rfl:  .  timolol (TIMOPTIC) 0.5 % ophthalmic solution, Place 1 drop into both eyes every morning. , Disp: , Rfl:

## 2017-07-15 NOTE — Patient Instructions (Addendum)
Pulmonary nodule: We will check a Chest X-ray and call you with the results Depending on the result of the Chest X-ray, we will determine when you need to follow-up

## 2017-07-16 ENCOUNTER — Telehealth: Payer: Self-pay | Admitting: Pulmonary Disease

## 2017-07-16 NOTE — Telephone Encounter (Signed)
Spoke with patient. [facility]   Nothing else was needed at time of call.

## 2017-07-16 NOTE — Telephone Encounter (Signed)
[facility]

## 2017-07-16 NOTE — Telephone Encounter (Signed)
Lupita Leash, MD  Velvet Bathe, CMA        A,  Please let the patient know this was OK  Thanks,  B    Pt is aware and voiced her understanding.  Pt states she does not currently have PCP. Pt states Dr. Jonny Ruiz started her on Synthroid 26mo ago. Pt states she has stopped taking this medication this morning, due to noticing bilateral leg and feet edema.  Pt would like BQ's recommendations on PCP, pt has been dismissed from downstairs.   BQ please advise. Thanks.

## 2017-07-18 ENCOUNTER — Telehealth: Payer: Self-pay | Admitting: Pulmonary Disease

## 2017-07-18 NOTE — Telephone Encounter (Signed)
Any [place] doctor at the [facility] location If not available then any [place] doctor at the [address] location

## 2017-07-18 NOTE — Telephone Encounter (Signed)
Called and spoke with pt and she will [facility] the [place] [facility] to get set up with a new PCP.

## 2017-07-18 NOTE — Telephone Encounter (Signed)
Spoke with pt, who states [facility] is not currently accepting new patients at this time.  [facility]  BQ please advise. Thanks.

## 2017-09-06 ENCOUNTER — Ambulatory Visit: Payer: Medicare Other | Admitting: Family Medicine

## 2017-10-09 ENCOUNTER — Ambulatory Visit: Payer: Medicare Other | Admitting: Family Medicine

## 2017-10-13 ENCOUNTER — Other Ambulatory Visit: Payer: Self-pay | Admitting: Internal Medicine

## 2017-11-14 ENCOUNTER — Encounter: Payer: Self-pay | Admitting: Family Medicine

## 2017-11-14 ENCOUNTER — Ambulatory Visit (INDEPENDENT_AMBULATORY_CARE_PROVIDER_SITE_OTHER): Payer: Medicare Other | Admitting: Family Medicine

## 2017-11-14 VITALS — BP 148/79 | HR 78 | Ht 64.0 in | Wt 170.0 lb

## 2017-11-14 DIAGNOSIS — M810 Age-related osteoporosis without current pathological fracture: Secondary | ICD-10-CM

## 2017-11-14 DIAGNOSIS — E782 Mixed hyperlipidemia: Secondary | ICD-10-CM | POA: Diagnosis not present

## 2017-11-14 DIAGNOSIS — I10 Essential (primary) hypertension: Secondary | ICD-10-CM

## 2017-11-14 DIAGNOSIS — K219 Gastro-esophageal reflux disease without esophagitis: Secondary | ICD-10-CM | POA: Diagnosis not present

## 2017-11-14 DIAGNOSIS — E781 Pure hyperglyceridemia: Secondary | ICD-10-CM | POA: Diagnosis not present

## 2017-11-14 DIAGNOSIS — R748 Abnormal levels of other serum enzymes: Secondary | ICD-10-CM

## 2017-11-14 DIAGNOSIS — K589 Irritable bowel syndrome without diarrhea: Secondary | ICD-10-CM

## 2017-11-14 DIAGNOSIS — E663 Overweight: Secondary | ICD-10-CM | POA: Diagnosis not present

## 2017-11-14 DIAGNOSIS — R5383 Other fatigue: Secondary | ICD-10-CM | POA: Diagnosis not present

## 2017-11-14 DIAGNOSIS — R600 Localized edema: Secondary | ICD-10-CM

## 2017-11-14 DIAGNOSIS — E039 Hypothyroidism, unspecified: Secondary | ICD-10-CM | POA: Insufficient documentation

## 2017-11-14 DIAGNOSIS — R609 Edema, unspecified: Secondary | ICD-10-CM

## 2017-11-14 MED ORDER — LEVOTHYROXINE SODIUM 50 MCG PO TABS: 50.0000 ug | ORAL_TABLET | Freq: Every day | ORAL | 3 refills | Status: AC

## 2017-11-14 MED ORDER — FUROSEMIDE 20 MG PO TABS: ORAL_TABLET | ORAL | 0 refills | Status: AC

## 2017-11-14 MED ORDER — AMLODIPINE BESYLATE 10 MG PO TABS: 10.0000 mg | ORAL_TABLET | Freq: Every day | ORAL | 1 refills | Status: DC

## 2017-11-14 NOTE — Progress Notes (Signed)
New patient office visit note:  Impression and Recommendations:    1. Essential hypertension   2. Irritable bowel syndrome, unspecified type   3. Gastroesophageal reflux disease, esophagitis presence not specified   4. Hypertriglyceridemia   5. Mixed hyperlipidemia   6. hx of Fatigue, unspecified type   7. Elevated liver enzymes   8. Postmenopausal bone loss   9. Hypothyroidism, unspecified type   10. Overweight (BMI 25.0-29.9)   11. Mild peripheral edema     1. Essential HTN:  -refill meds. Labs obtained. BP stable. 2. IBS -check labs 3. GERD -continue OTC meds prn, check labs  4. Hypertriglyceridemia -in recent past, was >1000, will check fasting labs today  5. Mixed HLD -check labs  6. H/o fatigue -check b12, cbc, other labs TSH  7. H/o Elevated liver enzymes -obtain labs.   8. Postmenopausal bone loss -check vitamin D  9. Hypothyroidism -check t4 and tsh  10. Overweight BMI  -recommended pt lose weight.  11. Mild peripheral edema- pt takes lasix only prn. Pt will use judiciously. Encouraged increased water intake and daily exercise as most important thing to prevent peripheral edema.  Recommend daily exercise (30 minutes a day).  -Drink adequate amounts of water per day, equal to half of your weight in oz.    Education and routine counseling performed. Handouts provided.  Orders Placed This Encounter  Procedures  . Comprehensive metabolic panel  . Hemoglobin A1c  . VITAMIN D 25 Hydroxy (Vit-D Deficiency, Fractures)  . TSH  . CBC with Differential/Platelet  . Vitamin B12  . T3, free    Meds ordered this encounter  Medications  . amLODipine (NORVASC) 10 MG tablet    Sig: Take 1 tablet (10 mg total) by mouth daily.    Dispense:  90 tablet    Refill:  1  . furosemide (LASIX) 20 MG tablet    Sig: Take 1/4 tablet by mouth every 3rd day as needed for peripheral edema    Dispense:  30 tablet    Refill:  0  . levothyroxine (SYNTHROID,  LEVOTHROID) 50 MCG tablet    Sig: Take 1 tablet (50 mcg total) by mouth daily.    Dispense:  90 tablet    Refill:  3    Gross side effects, risk and benefits, and alternatives of medications discussed with patient.  Patient is aware that all medications have potential side effects and we are unable to predict every side effect or drug-drug interaction that may occur.  Expresses verbal understanding and consents to current therapy plan and treatment regimen.  Return for OV w me my next available chronic follow-up- even if in 2-3 d.  Please see AVS handed out to patient at the end of our visit for further patient instructions/ counseling done pertaining to today's office visit.    Note: This document was prepared using Dragon voice recognition software and may include unintentional dictation errors.  This document serves as a record of services personally performed by Thomasene Lot, DO. It was created on her behalf by Thelma Barge, a trained medical scribe. The creation of this record is based on the scribe's personal observations and the provider's statements to them.   I have reviewed the above medical documentation for accuracy and completeness and I concur.  Thomasene Lot 11/14/17 3:27 PM   ---------------------------------------------------------------------------------------------------------------------- Subjective:    Chief complaint:   Chief Complaint  Patient presents with  . Establish Care     HPI: Shelly Mutch"  [name] is a pleasant 77 y.o. female who presents to [facility] today to review their medical history with me and establish care.   I asked the patient to review their chronic problem list with me to ensure everything was updated and accurate.    All recent office visits with other providers, any medical records that patient brought in etc  - I reviewed today.     We asked pt to get [place] their medical records from [facility] providers/  specialists that they had seen within the past 3-5 years- if they are in private practice and/or do not work for [employer], [facility], [facility], [facility] or [facility] owned practice.  Told them to call their specialists to clarify this if they are not sure.    Personal information: She worked for [employer], heart care for 11 years, but all her previous PCP's were retiring, so she is establishing today as she hasn't seen a doctor in a while.  She was a Audiological scientist, she worked in a number of areas in the clinic.  She does not know anyone who comes here but she lives nearby, about 3-4 miles away.  Her husband passed 1 year ago. She has 2 children and 4 grandchildren, and 3 great grandchildren. She also has 4 kittens.    She does not exercise regularly, but she does do a lot of work around her yard.   She sings in a quartet and she is involved with church [facility]). She states she does not go out in public too much.    Other providers: Dr. Debby Bud, her previous PCP retired 1 year ago. Dr. Kendrick Fries, pulmonology. Dr. Lottie Dawson, ophthalmology. Dr. Carlyle Basques, cardiology. OBGYN: [facility], her last visit 3 years ago.  Dr. Jarold Motto, GI, now retired- at [facility] (she had colonoscopy there)  PMHx: Previous smoker: 20 year 0.5 packs per day, she quit 5 years ago.  HTN: she takes amlodipine 10mg  daily. She denies any problems but takes this due to her age. She takes 0.25 tablets lasix PRN (maybe once a week) due to some mild swelling in her ankles and hands. Hypothyroidism: she takes meds  - she states she has gained 10 lbs since she started taking this medication.  Reflux- she loves orange juice and gets heart burn from this. Anxiety- due to losing her husband and twin brother.  Glaucoma-managed by Dr Lottie Dawson, ophthalmology.  FMHx: Twin brother with CAD.  Pancreatic CA: Mom (age 49) and twin brother (age 46). She gets checked regularly for pancreatic levels as well as other  scans.    Wt Readings from Last 3 Encounters:  11/14/17 170 lb (77.1 kg)  07/15/17 170 lb 9.6 oz (77.4 kg)  03/23/17 160 lb (72.6 kg)   BP Readings from Last 3 Encounters:  11/14/17 (!) 148/79  07/15/17 128/76  03/23/17 (!) 153/78   Pulse Readings from Last 3 Encounters:  11/14/17 78  07/15/17 69  03/23/17 81   BMI Readings from Last 3 Encounters:  11/14/17 29.18 kg/m  07/15/17 29.28 kg/m  03/23/17 26.63 kg/m    Patient Care Team    Relationship Specialty Notifications Start End  Thomasene Lot, DO PCP - General Family Medicine  11/14/17   Wendall Stade, MD Consulting Physician Cardiology  11/14/17   Lupita Leash, MD Consulting Physician Pulmonary Disease  11/14/17   Mardella Layman, MD Consulting Physician Gastroenterology  11/14/17   Tilden Dome, MD Referring Physician Ophthalmology  11/14/17  Patient Active Problem List   Diagnosis Date Noted  . Hypertriglyceridemia 11/14/2017    Priority: High  . HTN (hypertension)     Priority: High  . Mixed hyperlipidemia     Priority: High  . IBS (irritable bowel syndrome)     Priority: Medium  . Postmenopausal bone loss 11/14/2017    Priority: Low  . Elevated liver enzymes 11/14/2017  . Fatigue 11/14/2017  . Mild peripheral edema 11/14/2017  . Overweight (BMI 25.0-29.9) 11/14/2017  . Hypothyroidism 11/14/2017  . Acute gout 10/12/2016  . Encounter for well adult exam with abnormal findings 01/27/2015  . GERD (gastroesophageal reflux disease)   . History of kidney stones   . Arthritis   . Glaucoma   . Anxiety   . Primary open angle glaucoma of both eyes, mild stage 08/23/2014  . Left inguinal hernia 05/14/2012  . Nuclear sclerosis 01/18/2012  . CAROTID ARTERY DISEASE 11/24/2010  . Pulmonary nodule, left 06/05/2008  . DIVERTICULOSIS OF COLON 06/05/2008  . ANGIOEDEMA 06/05/2008  . Personal history of other diseases of digestive system 06/05/2008  . NEPHROLITHIASIS, HX OF 06/05/2008  . Cough  06/04/2008  . Diverticulosis 04/21/2007     Past Medical History:  Diagnosis Date  . Anxiety   . Arthritis   . Diverticulosis 04/21/2007  . Gastritis 04/21/2007  . GERD (gastroesophageal reflux disease)   . Glaucoma   . Hemorrhoids   . Hiatal hernia 04/21/2007  . History of angioedema   . History of kidney stones   . History of pneumonia   . HLD (hyperlipidemia)   . HTN (hypertension)    stress  . IBS (irritable bowel syndrome)      Past Medical History:  Diagnosis Date  . Anxiety   . Arthritis   . Diverticulosis 04/21/2007  . Gastritis 04/21/2007  . GERD (gastroesophageal reflux disease)   . Glaucoma   . Hemorrhoids   . Hiatal hernia 04/21/2007  . History of angioedema   . History of kidney stones   . History of pneumonia   . HLD (hyperlipidemia)   . HTN (hypertension)    stress  . IBS (irritable bowel syndrome)      Past Surgical History:  Procedure Laterality Date  . APPENDECTOMY     age 77  . BLADDER REPAIR     repair left ureter- "wrapped around kidney"  . CHOLECYSTECTOMY    . INGUINAL HERNIA REPAIR  06/06/2012   Procedure: HERNIA REPAIR INGUINAL ADULT;  Surgeon: Shelly Rubensteinouglas A Blackman, MD;  Location: [facility];  Service: General;  Laterality: Left;  . KIDNEY STONE SURGERY     lithotripsy  . TONSILLECTOMY       Family History  Problem Relation Age of Onset  . Pancreatic cancer Mother   . COPD Father   . Pancreatic cancer Brother   . Diabetes Brother   . Heart attack Brother   . Colon cancer Neg Hx      Social History   Substance and Sexual Activity  Drug Use No     Social History   Substance and Sexual Activity  Alcohol Use No  . Alcohol/week: 0.0 oz     Social History   Tobacco Use  Smoking Status Former Smoker  . Packs/day: 0.25  . Years: 20.00  . Pack years: 5.00  . Types: Cigarettes  . Last attempt to quit: 11/08/2012  . Years since quitting: 5.0  Smokeless Tobacco Never Used     Current Meds  Medication Sig  .  acetaminophen (TYLENOL) 500 MG tablet Take 500 mg by mouth every 6 (six) hours as needed. For pain  . amLODipine (NORVASC) 10 MG tablet Take 1 tablet (10 mg total) by mouth daily.  . furosemide (LASIX) 20 MG tablet Take 1/4 tablet by mouth every 3rd day as needed for peripheral edema  . latanoprost (XALATAN) 0.005 % ophthalmic solution Place 1 drop into both eyes at bedtime.   Marland Kitchen levothyroxine (SYNTHROID, LEVOTHROID) 50 MCG tablet Take 1 tablet (50 mcg total) by mouth daily.  . ranitidine (ZANTAC) 150 MG capsule Take 150 mg daily as needed by mouth.   . timolol (TIMOPTIC) 0.5 % ophthalmic solution Place 1 drop into both eyes every morning.   . [DISCONTINUED] amLODipine (NORVASC) 10 MG tablet Take 1 tablet (10 mg total) by mouth daily.  . [DISCONTINUED] furosemide (LASIX) 20 MG tablet Take 1/4 tablet by mouth every 3rd day as needed for peripheral edema (Patient taking differently: Take 5-20 mg by mouth daily as needed for fluid or edema. )  . [DISCONTINUED] levothyroxine (SYNTHROID, LEVOTHROID) 50 MCG tablet Take 1 tablet (50 mcg total) by mouth daily.    Allergies: Aspirin; Iohexol; Naproxen; Penicillins; and Shellfish allergy   Review of Systems  Constitutional: Negative for chills, diaphoresis, fever, malaise/fatigue and weight loss.  HENT: Negative for congestion, sore throat and tinnitus.   Eyes: Negative for blurred vision, double vision and photophobia.  Respiratory: Negative for cough and wheezing.   Cardiovascular: Negative for chest pain and palpitations.  Gastrointestinal: Negative for blood in stool, diarrhea, nausea and vomiting.  Genitourinary: Negative for dysuria, frequency and urgency.  Musculoskeletal: Negative for joint pain and myalgias.  Skin: Negative for itching and rash.  Neurological: Negative for dizziness, focal weakness, weakness and headaches.  Endo/Heme/Allergies: Negative for environmental allergies and polydipsia. Does not bruise/bleed easily.   Psychiatric/Behavioral: Negative for depression and memory loss. The patient is not nervous/anxious and does not have insomnia.      Objective:   Blood pressure (!) 148/79, pulse 78, height 5\' 4"  (1.626 m), weight 170 lb (77.1 kg), SpO2 97 %. Body mass index is 29.18 kg/m. General: Well Developed, well nourished, and in no acute distress.  Neuro: Alert and oriented x3, extra-ocular muscles intact, sensation grossly intact.  HEENT:[place]/AT, PERRLA, neck supple, No carotid bruits Skin: no gross rashes  Cardiac: Regular rate and rhythm Respiratory: Essentially clear to auscultation bilaterally. Not using accessory muscles, speaking in full sentences.  Abdominal: not grossly distended Musculoskeletal: Ambulates w/o diff, FROM * 4 ext.  Vasc: less 2 sec cap RF, warm and pink  Psych:  No HI/SI, judgement and insight good, Euthymic mood. Full Affect.    No results found for this or any previous visit (from the past 2160 hour(s)).

## 2017-11-14 NOTE — Patient Instructions (Addendum)

## 2017-11-15 LAB — COMPREHENSIVE METABOLIC PANEL
ALBUMIN: 4.7 g/dL (ref 3.5–4.8)
ALK PHOS: 106 IU/L (ref 39–117)
ALT: 16 IU/L (ref 0–32)
AST: 22 IU/L (ref 0–40)
Albumin/Globulin Ratio: 2.1 (ref 1.2–2.2)
BILIRUBIN TOTAL: 0.4 mg/dL (ref 0.0–1.2)
BUN / CREAT RATIO: 14 (ref 12–28)
BUN: 12 mg/dL (ref 8–27)
CHLORIDE: 104 mmol/L (ref 96–106)
CO2: 23 mmol/L (ref 20–29)
Calcium: 9.5 mg/dL (ref 8.7–10.3)
Creatinine, Ser: 0.87 mg/dL (ref 0.57–1.00)
GFR calc Af Amer: 76 mL/min/{1.73_m2} (ref >59)
GFR calc non Af Amer: 66 mL/min/{1.73_m2} (ref >59)
GLUCOSE: 84 mg/dL (ref 65–99)
Globulin, Total: 2.2 g/dL (ref 1.5–4.5)
POTASSIUM: 4.5 mmol/L (ref 3.5–5.2)
Sodium: 143 mmol/L (ref 134–144)
Total Protein: 6.9 g/dL (ref 6.0–8.5)

## 2017-11-15 LAB — HEMOGLOBIN A1C
Est. average glucose Bld gHb Est-mCnc: 117 mg/dL
Hgb A1c MFr Bld: 5.7 % — ABNORMAL HIGH (ref 4.8–5.6)

## 2017-11-15 LAB — CBC WITH DIFFERENTIAL/PLATELET
Basophils Absolute: 0.1 x10E3/uL (ref 0.0–0.2)
Basos: 1 %
EOS (ABSOLUTE): 0.3 x10E3/uL (ref 0.0–0.4)
Eos: 4 %
Hematocrit: 44 % (ref 34.0–46.6)
Hemoglobin: 14.3 g/dL (ref 11.1–15.9)
Immature Grans (Abs): 0 x10E3/uL (ref 0.0–0.1)
Immature Granulocytes: 0 %
Lymphocytes Absolute: 2.2 x10E3/uL (ref 0.7–3.1)
Lymphs: 32 %
MCH: 28.7 pg (ref 26.6–33.0)
MCHC: 32.5 g/dL (ref 31.5–35.7)
MCV: 88 fL (ref 79–97)
Monocytes Absolute: 0.6 x10E3/uL (ref 0.1–0.9)
Monocytes: 8 %
Neutrophils Absolute: 3.8 x10E3/uL (ref 1.4–7.0)
Neutrophils: 55 %
Platelets: 223 x10E3/uL (ref 150–379)
RBC: 4.98 x10E6/uL (ref 3.77–5.28)
RDW: 14 % (ref 12.3–15.4)
WBC: 7 x10E3/uL (ref 3.4–10.8)

## 2017-11-15 LAB — VITAMIN B12: Vitamin B-12: 376 pg/mL (ref 232–1245)

## 2017-11-15 LAB — T3, FREE: T3, Free: 2.5 pg/mL (ref 2.0–4.4)

## 2017-11-15 LAB — TSH: TSH: 6.32 u[IU]/mL — ABNORMAL HIGH (ref 0.450–4.500)

## 2017-11-15 LAB — VITAMIN D 25 HYDROXY (VIT D DEFICIENCY, FRACTURES): Vit D, 25-Hydroxy: 11.4 ng/mL — ABNORMAL LOW (ref 30.0–100.0)

## 2017-11-25 DIAGNOSIS — I10 Essential (primary) hypertension: Secondary | ICD-10-CM | POA: Diagnosis not present

## 2017-11-29 ENCOUNTER — Ambulatory Visit: Payer: Medicare Other | Admitting: Family Medicine

## 2018-01-01 ENCOUNTER — Other Ambulatory Visit: Payer: Self-pay | Admitting: Internal Medicine

## 2018-05-23 ENCOUNTER — Other Ambulatory Visit: Payer: Self-pay | Admitting: Family Medicine

## 2018-05-23 DIAGNOSIS — I10 Essential (primary) hypertension: Secondary | ICD-10-CM

## 2018-06-09 ENCOUNTER — Telehealth: Payer: Self-pay | Admitting: Nurse Practitioner

## 2018-06-09 NOTE — Telephone Encounter (Signed)
Patient states has switched providers & not coming back to [facility]-- forwarding message to medical assistant & removed Dr. Val Eagle from PCP .  --Fausto Skillern

## 2018-06-25 ENCOUNTER — Other Ambulatory Visit: Payer: Self-pay | Admitting: Family Medicine

## 2018-06-25 DIAGNOSIS — I10 Essential (primary) hypertension: Secondary | ICD-10-CM

## 2018-08-05 ENCOUNTER — Other Ambulatory Visit: Payer: Self-pay | Admitting: Family Medicine

## 2018-08-05 DIAGNOSIS — I10 Essential (primary) hypertension: Secondary | ICD-10-CM

## 2018-08-20 ENCOUNTER — Other Ambulatory Visit: Payer: Self-pay | Admitting: Family Medicine

## 2018-08-20 DIAGNOSIS — I10 Essential (primary) hypertension: Secondary | ICD-10-CM

## 2018-08-28 ENCOUNTER — Other Ambulatory Visit: Payer: Self-pay | Admitting: Family Medicine

## 2018-08-28 DIAGNOSIS — I10 Essential (primary) hypertension: Secondary | ICD-10-CM

## 2018-09-04 ENCOUNTER — Other Ambulatory Visit: Payer: Self-pay | Admitting: Family Medicine

## 2018-09-04 DIAGNOSIS — E039 Hypothyroidism, unspecified: Secondary | ICD-10-CM

## 2018-10-29 IMAGING — DX DG FOOT COMPLETE 3+V*L*
3 series · 3 of 3 positions shown · non-contrast
Comparison: None.

CLINICAL DATA: Ankle and foot pain.  Third toe most severe.

EXAM:
LEFT FOOT - COMPLETE 3+ VIEW

[foot ap]
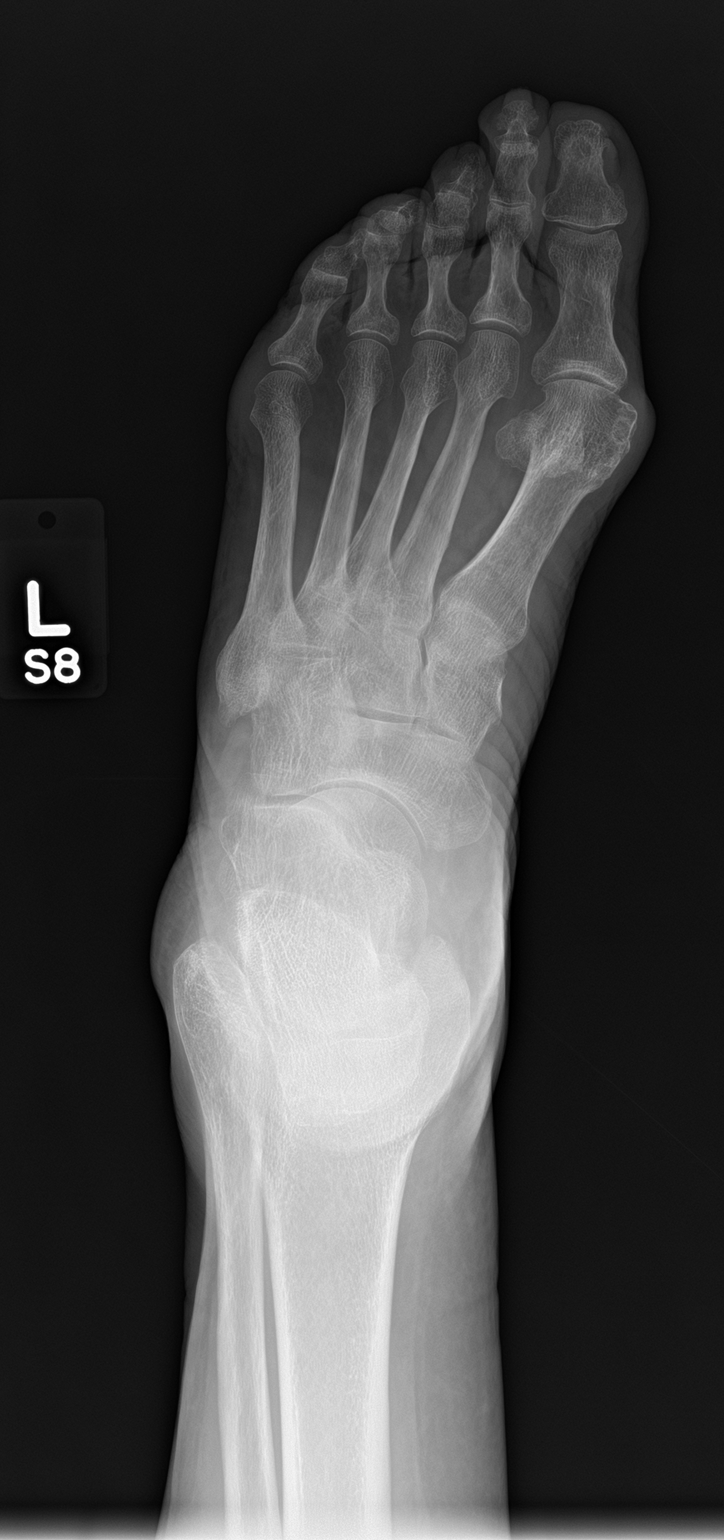

[foot obl]
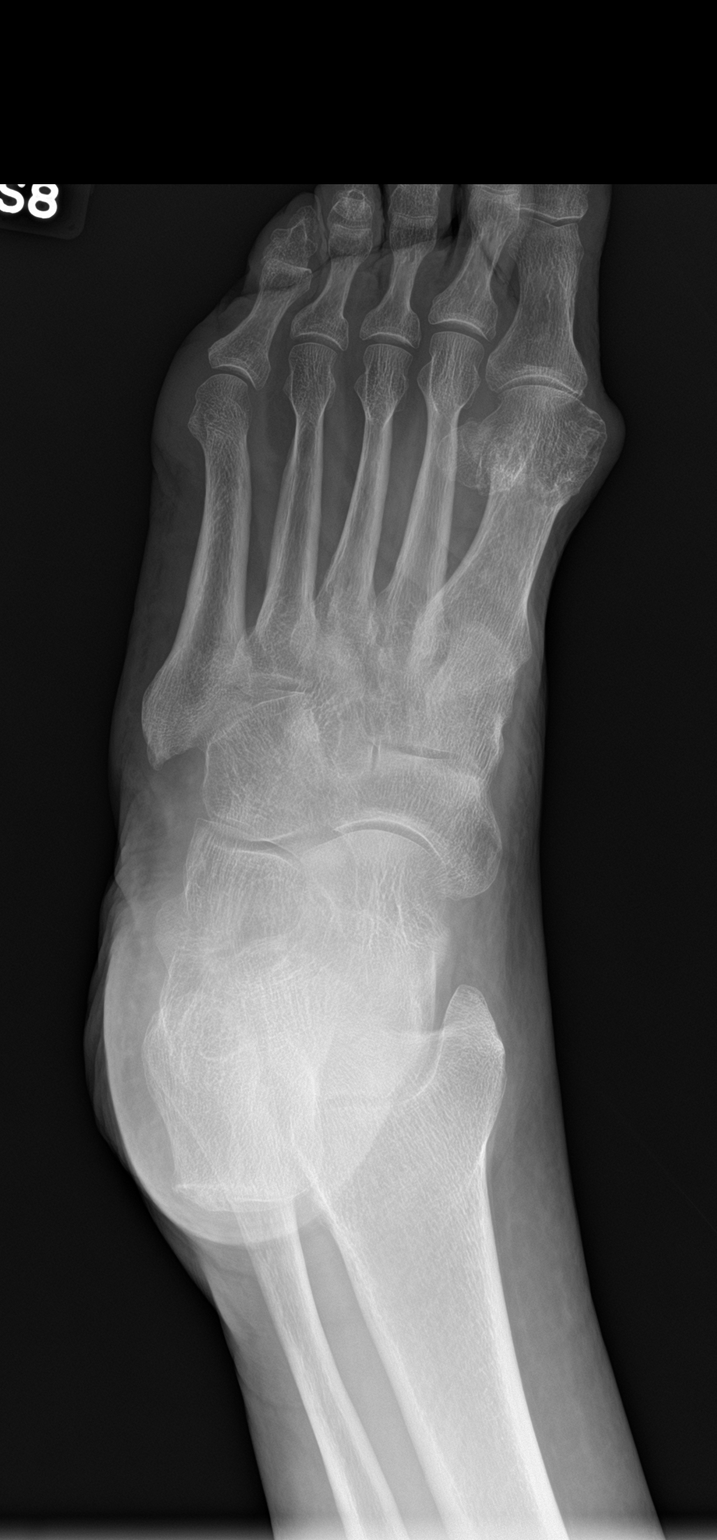

[foot lat]
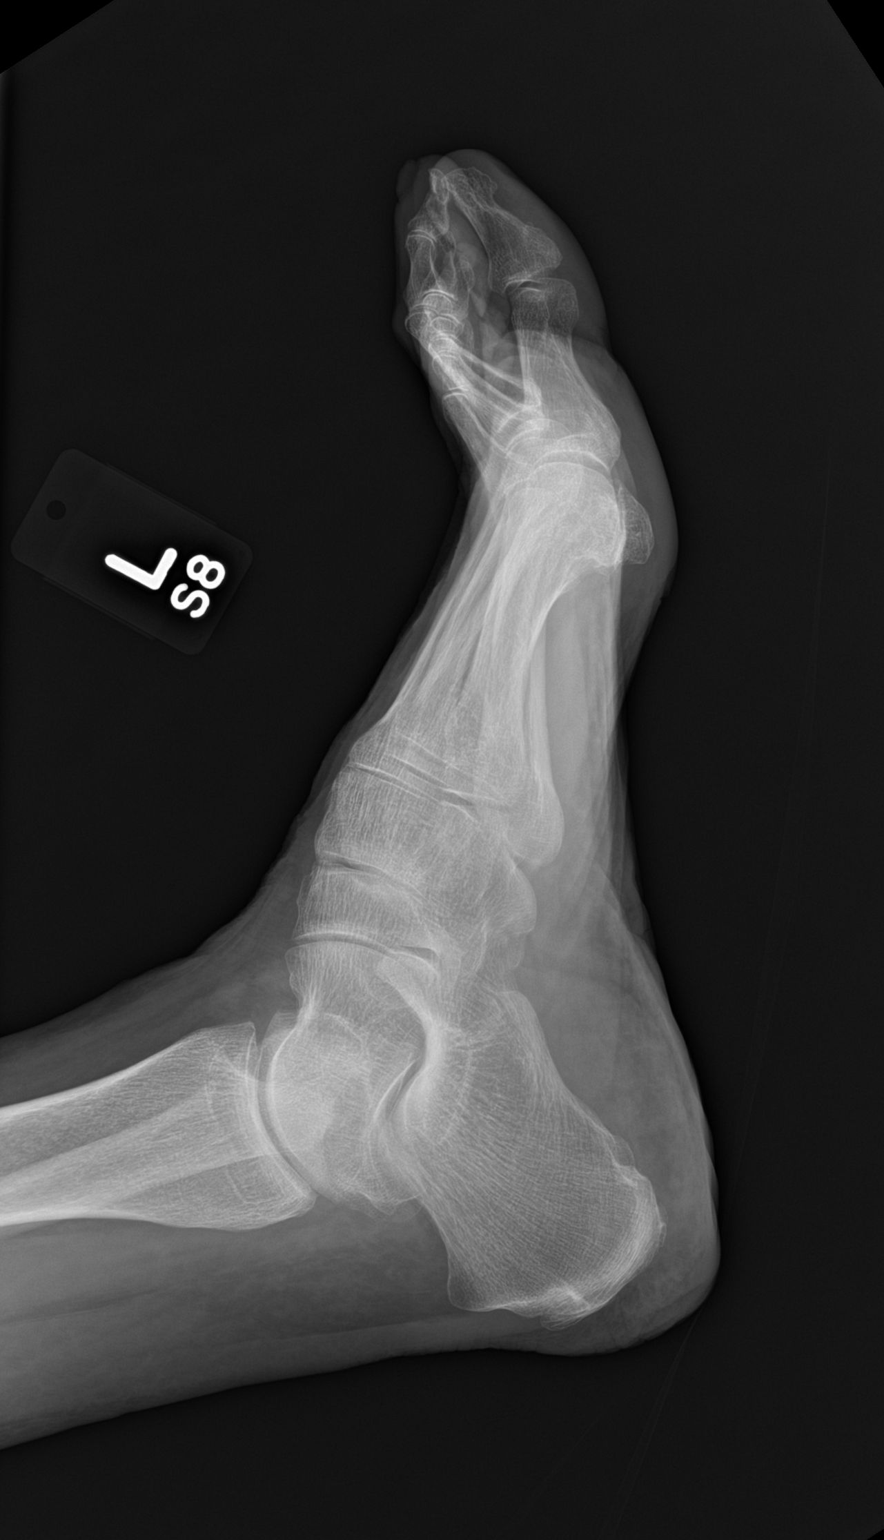

[3 of 3 positions shown; findings below may reference images not displayed]

FINDINGS: Hallux valgus deformity with degenerative changes of the MTP of the
great toe. Other MTP joints appear normal. Inter phalangeal joints
are unremarkable. Midfoot and hindfoot appear normal.
IMPRESSION: Hallux valgus deformity of the great toe. Otherwise negative.
Specifically, no abnormality of the third toe identified.

## 2019-08-25 ENCOUNTER — Ambulatory Visit: Payer: Medicare Other | Admitting: Internal Medicine

## 2019-11-08 ENCOUNTER — Other Ambulatory Visit: Payer: Self-pay

## 2019-11-08 ENCOUNTER — Encounter (HOSPITAL_COMMUNITY): Payer: Self-pay | Admitting: Emergency Medicine

## 2019-11-08 ENCOUNTER — Emergency Department (HOSPITAL_COMMUNITY)
Admission: EM | Admit: 2019-11-08 | Discharge: 2019-11-08 | Disposition: A | Discharge status: Home / Self Care | Payer: Medicare Other | Attending: Emergency Medicine | Admitting: Emergency Medicine

## 2019-11-08 DIAGNOSIS — N3001 Acute cystitis with hematuria: Secondary | ICD-10-CM | POA: Insufficient documentation

## 2019-11-08 DIAGNOSIS — Z79899 Other long term (current) drug therapy: Secondary | ICD-10-CM | POA: Diagnosis not present

## 2019-11-08 DIAGNOSIS — Z87891 Personal history of nicotine dependence: Secondary | ICD-10-CM | POA: Diagnosis not present

## 2019-11-08 DIAGNOSIS — R300 Dysuria: Secondary | ICD-10-CM | POA: Diagnosis present

## 2019-11-08 DIAGNOSIS — E039 Hypothyroidism, unspecified: Secondary | ICD-10-CM | POA: Insufficient documentation

## 2019-11-08 DIAGNOSIS — I10 Essential (primary) hypertension: Secondary | ICD-10-CM | POA: Diagnosis not present

## 2019-11-08 LAB — URINALYSIS, MICROSCOPIC (REFLEX)

## 2019-11-08 LAB — URINALYSIS, ROUTINE W REFLEX MICROSCOPIC
Bilirubin Urine: NEGATIVE
Glucose, UA: NEGATIVE mg/dL
Ketones, ur: NEGATIVE mg/dL
Nitrite: NEGATIVE
Protein, ur: 100 mg/dL — AB
Specific Gravity, Urine: 1.015 (ref 1.005–1.030)
pH: 6.5 (ref 5.0–8.0)

## 2019-11-08 MED ORDER — CEPHALEXIN 500 MG PO CAPS: 500.0000 mg | ORAL_CAPSULE | Freq: Three times a day (TID) | ORAL | 0 refills | Status: AC

## 2019-11-08 MED ORDER — CEPHALEXIN 500 MG PO CAPS
500.0000 mg | ORAL_CAPSULE | Freq: Once | ORAL | Status: AC
  Administered 2019-11-08: 500 mg via ORAL
  Filled 2019-11-08: qty 1

## 2019-11-08 MED ORDER — PHENAZOPYRIDINE HCL 100 MG PO TABS
100.0000 mg | ORAL_TABLET | Freq: Once | ORAL | Status: AC
  Administered 2019-11-08: 100 mg via ORAL
  Filled 2019-11-08: qty 1

## 2019-11-08 MED ORDER — CEPHALEXIN 500 MG PO CAPS: 500.0000 mg | ORAL_CAPSULE | Freq: Three times a day (TID) | ORAL | 0 refills | Status: DC

## 2019-11-08 NOTE — ED Provider Notes (Signed)
[facility]-EMERGENCY DEPT Provider Note   CSN: [id number] Arrival date & time: 11/08/19  0240     History Chief Complaint  Patient presents with  . Dysuria    Shelly [name] is a 79 y.o. female with a history of nephrolithiasis, hypertension, hyperlipidemia, IBS, & prior abdominal surgeries including appendectomy, cholecystectomy, lithotripsy, bladder repair, & inguinal hernia repair who presents to the ED with complaints of dysuria that began this AM. Patient reports dysuria, frequency, urgency, hematuria & suprapubic pressure. No specific alleviating/aggravating factors. No history of similar. States it does not feel like prior kidney stones. Denies fever, chills, nausea, vomiting, flank pain, vaginal discharge, diarrhea, constipation, or melena.     HPI     Past Medical History:  Diagnosis Date  . Anxiety   . Arthritis   . Diverticulosis 04/21/2007  . Gastritis 04/21/2007  . GERD (gastroesophageal reflux disease)   . Glaucoma   . Hemorrhoids   . Hiatal hernia 04/21/2007  . History of angioedema   . History of kidney stones   . History of pneumonia   . HLD (hyperlipidemia)   . HTN (hypertension)    stress  . IBS (irritable bowel syndrome)     Patient Active Problem List   Diagnosis Date Noted  . Hypertriglyceridemia 11/14/2017  . Elevated liver enzymes 11/14/2017  . Fatigue 11/14/2017  . Postmenopausal bone loss 11/14/2017  . Mild peripheral edema 11/14/2017  . Overweight (BMI 25.0-29.9) 11/14/2017  . Hypothyroidism 11/14/2017  . Acute gout 10/12/2016  . Encounter for well adult exam with abnormal findings 01/27/2015  . IBS (irritable bowel syndrome)   . GERD (gastroesophageal reflux disease)   . History of kidney stones   . HTN (hypertension)   . Mixed hyperlipidemia   . Arthritis   . Glaucoma   . Anxiety   . Primary open angle glaucoma of both eyes, mild stage 08/23/2014  . Left inguinal hernia 05/14/2012  . Nuclear sclerosis  01/18/2012  . CAROTID ARTERY DISEASE 11/24/2010  . Pulmonary nodule, left 06/05/2008  . DIVERTICULOSIS OF COLON 06/05/2008  . ANGIOEDEMA 06/05/2008  . Personal history of other diseases of digestive system 06/05/2008  . NEPHROLITHIASIS, HX OF 06/05/2008  . Cough 06/04/2008  . Diverticulosis 04/21/2007    Past Surgical History:  Procedure Laterality Date  . APPENDECTOMY     age 20  . BLADDER REPAIR     repair left ureter- "wrapped around kidney"  . CHOLECYSTECTOMY    . INGUINAL HERNIA REPAIR  06/06/2012   Procedure: HERNIA REPAIR INGUINAL ADULT;  Surgeon: Shelly Rubenstein, MD;  Location: [facility];  Service: General;  Laterality: Left;  . KIDNEY STONE SURGERY     lithotripsy  . TONSILLECTOMY       OB History   No obstetric history on file.     Family History  Problem Relation Age of Onset  . Pancreatic cancer Mother   . COPD Father   . Pancreatic cancer Brother   . Diabetes Brother   . Heart attack Brother   . Colon cancer Neg Hx     Social History   Tobacco Use  . Smoking status: Former Smoker    Packs/day: 0.25    Years: 20.00    Pack years: 5.00    Types: Cigarettes    Quit date: 11/08/2012    Years since quitting: 7.0  . Smokeless tobacco: Never Used  Substance Use Topics  . Alcohol use: No    Alcohol/week: 0.0 standard drinks  .  Drug use: No    Home Medications Prior to Admission medications   Medication Sig Start Date End Date Taking? Authorizing Provider  acetaminophen (TYLENOL) 500 MG tablet Take 500 mg by mouth every 6 (six) hours as needed. For pain    [provider]  amLODipine (NORVASC) 10 MG tablet Take 1 tablet (10 mg total) by mouth daily. Patient needs office visit for further refills 05/23/18   Thomasene Lot, DO  furosemide (LASIX) 20 MG tablet Take 1/4 tablet by mouth every 3rd day as needed for peripheral edema 11/14/17   Opalski, Deborah, DO  latanoprost (XALATAN) 0.005 % ophthalmic solution Place 1 drop into both eyes at  bedtime.     [provider]  levothyroxine (SYNTHROID, LEVOTHROID) 50 MCG tablet Take 1 tablet (50 mcg total) by mouth daily. 11/14/17   Opalski, Gavin Pound, DO  ranitidine (ZANTAC) 150 MG capsule Take 150 mg daily as needed by mouth.     [provider]  timolol (TIMOPTIC) 0.5 % ophthalmic solution Place 1 drop into both eyes every morning.     [provider]    Allergies    Aspirin, Iohexol, Naproxen, Penicillins, and Shellfish allergy  Review of Systems   Review of Systems  Constitutional: Negative for chills and fever.  Respiratory: Negative for shortness of breath.   Cardiovascular: Negative for chest pain.  Gastrointestinal: Positive for abdominal pain (suprapubic pressure). Negative for blood in stool, constipation, diarrhea, nausea and vomiting.  Genitourinary: Positive for dysuria, frequency and urgency. Negative for flank pain, hematuria and vaginal discharge.  Neurological: Negative for syncope.  All other systems reviewed and are negative.   Physical Exam Updated Vital Signs BP (!) 160/83 (BP Location: Right Arm)   Pulse 76   Temp (!) 97.3 F (36.3 C) (Oral)   Resp 18   SpO2 98%   Physical Exam Vitals and nursing note reviewed.  Constitutional:      General: She is not in acute distress.    Appearance: She is well-developed. She is not toxic-appearing.  HENT:     Head: Normocephalic and atraumatic.  Eyes:     General:        Right eye: No discharge.        Left eye: No discharge.     Conjunctiva/sclera: Conjunctivae normal.  Cardiovascular:     Rate and Rhythm: Normal rate and regular rhythm.  Pulmonary:     Effort: Pulmonary effort is normal. No respiratory distress.     Breath sounds: Normal breath sounds. No wheezing, rhonchi or rales.  Abdominal:     General: There is no distension.     Palpations: Abdomen is soft.     Tenderness: There is abdominal tenderness in the suprapubic area and left lower quadrant. There is no right CVA  tenderness or left CVA tenderness.     Comments: Patient states LLQ tenderness is baseline for her s/p hernia repair, no acute change.   Musculoskeletal:     Cervical back: Neck supple.  Skin:    General: Skin is warm and dry.     Findings: No rash.  Neurological:     Mental Status: She is alert.     Comments: Clear speech.   Psychiatric:        Behavior: Behavior normal.    ED Results / Procedures / Treatments   Labs (all labs ordered are listed, but only abnormal results are displayed) Labs Reviewed  URINALYSIS, ROUTINE W REFLEX MICROSCOPIC - Abnormal; Notable for the following  components:      Result Value   Color, Urine RED (*)    APPearance TURBID (*)    Hgb urine dipstick LARGE (*)    Protein, ur 100 (*)    Leukocytes,Ua MODERATE (*)    All other components within normal limits  URINALYSIS, MICROSCOPIC (REFLEX) - Abnormal; Notable for the following components:   Bacteria, UA MANY (*)    All other components within normal limits  URINE CULTURE    EKG None  Radiology No results found.  Procedures Procedures (including critical care time)  Medications Ordered in ED Medications - No data to display  ED Course  I have reviewed the triage vital signs and the nursing notes.  Pertinent labs & imaging results that were available during my care of the patient were reviewed by me and considered in my medical decision making (see chart for details).    MDM Rules/Calculators/A&P                      Patient presents to the ED with complaints of urinary sxs. Nontoxic appearing, resting comfortably, vitals with elevated BP doubt HTN emergency. On exam patient has mild suprapubic tenderness, also has some LLQ tenderness she states is baseline s/p hernia repair. No peritoneal signs. Urinalysis consistent w/ infection. No fever, emesis, flank pain, or CVA tenderness to indicate pyelonephritis. Does not appear septic requiring admission. No colicky pain, does not seem  consistent w/ nephrolithiasis. No peritoneal signs on repeat abdominal exam. No bowel sxs/concerns doubt perf/obstruction. Hx of PCN allergy- hives. Discussed with Dr. Ashok Cordia will treat with 500 mg Keflex TID x 5 days, I am in agreement. I discussed results, treatment plan, need for follow-up, and return precautions with the patient. Provided opportunity for questions, patient confirmed understanding and is in agreement with plan.   This is a shared visit with supervising physician Dr. Ashok Cordia who has independently evaluated patient & provided guidance in evaluation/management/disposition, in agreement with care   Final Clinical Impression(s) / ED Diagnoses Final diagnoses:  Acute cystitis with hematuria    Rx / DC Orders ED Discharge Orders         Ordered    cephALEXin (KEFLEX) 500 MG capsule  3 times daily     11/08/19 [address] [place], PA-C 11/08/19 1034    Lajean Saver, MD 11/08/19 1312

## 2019-11-08 NOTE — Discharge Instructions (Addendum)
You were seen in the ER today for urinary symptoms and were found to have a urinary tract infection which is what we suspect is causing your symptoms. We are sending you home with keflex, an antibiotic, to treat this infection.   We have prescribed you new medication(s) today. Discuss the medications prescribed today with your pharmacist as they can have adverse effects and interactions with your other medicines including over the counter and prescribed medications. Seek medical evaluation if you start to experience new or abnormal symptoms after taking one of these medicines, seek care immediately if you start to experience difficulty breathing, feeling of your throat closing, facial swelling, or rash as these could be indications of a more serious allergic reaction  Please follow up with your primary care provider within 3 days for re-evaluation. Return to the ER for new or worsening symptoms including but not limited to inability to urinate, fever, vomiting, increased pain, back pain, or any other concerns.

## 2019-11-08 NOTE — ED Triage Notes (Signed)
Pt reports dysuria and some blood in urine that started this morning.

## 2019-11-11 LAB — URINE CULTURE: Culture: 70000 — AB

## 2019-11-12 ENCOUNTER — Telehealth: Payer: Self-pay | Admitting: *Deleted

## 2019-11-12 NOTE — Telephone Encounter (Signed)
Post ED Visit - Positive Culture Follow-up  Culture report reviewed by antimicrobial stewardship pharmacist: [facility] []  Nathan Batchelder, Pharm.D. []  , Pharm.D., BCPS AQ-ID []  , Pharm.D., BCPS []  Celedonio Miyamoto, .D., BCPS []  [place], .D., BCPS, AAHIVP []  Georgina Pillion, Pharm.D., BCPS, AAHIVP []  [address], PharmD, BCPS []  , PharmD, BCPS []  [place], PharmD, BCPS []  [address], PharmD []  , PharmD, BCPS []  Estella Husk, PharmD  Pharmacy Team []  Lysle Pearl, PharmD []  , PharmD []  Phillips Climes, PharmD []  , Rph []  Agapito Games) , PharmD []  Verlan Friends, PharmD []  , PharmD []  Mervyn Gay, PharmD []  , PharmD []  Vinnie Level, PharmD []  [facility], PharmD []  , PharmD []  Len Childs, PharmD  , Pharm D  Positive urine culture Treated with Cephalexin, organism sensitive to the same and no further patient follow-up is required at this time.  Greer Pickerel [facility] 11/12/2019, 9:53 AM

## 2019-11-12 NOTE — Telephone Encounter (Signed)
Post ED Visit - Positive Culture Follow-up  Culture report reviewed by antimicrobial stewardship pharmacist: [facility] []  , Pharm.D. []  Enzo Bi, Pharm.D., BCPS AQ-ID []  , Pharm.D., BCPS []  Celedonio Miyamoto, Pharm.D., BCPS []  [place], Garvin Fila.D., BCPS, AAHIVP []  , Pharm.D., BCPS, AAHIVP []  Georgina Pillion, PharmD, BCPS []  , PharmD, BCPS []  [place], PharmD, BCPS []  [place], PharmD []  , PharmD, BCPS []  Estella Husk, PharmD  Pharmacy Team []  Lysle Pearl, PharmD []  , PharmD []  Phillips Climes, PharmD []  , Rph []  [employer]) , PharmD []  Verlan Friends, PharmD []  , PharmD []  Mervyn Gay, PharmD []  , PharmD []  Vinnie Level, PharmD []  [facility], PharmD []  , PharmD []  Len Childs, PharmD  , PharmD  Positive urine culture Treated with Cephalexin, organism sensitive to the same and no further patient follow-up is required at this time.  Greer Pickerel [facility] 11/12/2019, 10:43 AM

## 2019-12-21 ENCOUNTER — Other Ambulatory Visit: Payer: Self-pay | Admitting: Family Medicine

## 2019-12-21 ENCOUNTER — Other Ambulatory Visit (HOSPITAL_COMMUNITY): Payer: Self-pay | Admitting: Family Medicine

## 2019-12-21 DIAGNOSIS — R319 Hematuria, unspecified: Secondary | ICD-10-CM

## 2019-12-21 DIAGNOSIS — N390 Urinary tract infection, site not specified: Secondary | ICD-10-CM

## 2019-12-23 ENCOUNTER — Ambulatory Visit (HOSPITAL_COMMUNITY)
Admission: RE | Admit: 2019-12-23 | Discharge: 2019-12-23 | Disposition: A | Discharge status: Home / Self Care | Payer: Medicare Other | Source: Ambulatory Visit | Attending: Family Medicine | Admitting: Family Medicine

## 2019-12-23 ENCOUNTER — Other Ambulatory Visit: Payer: Self-pay

## 2019-12-23 ENCOUNTER — Encounter (HOSPITAL_COMMUNITY): Payer: Self-pay

## 2019-12-23 DIAGNOSIS — N390 Urinary tract infection, site not specified: Secondary | ICD-10-CM | POA: Insufficient documentation

## 2019-12-23 DIAGNOSIS — R319 Hematuria, unspecified: Secondary | ICD-10-CM | POA: Diagnosis present

## 2024-10-16 ENCOUNTER — Emergency Department (HOSPITAL_COMMUNITY)
Admission: EM | Admit: 2024-10-16 | Source: Home / Self Care | Attending: Emergency Medicine | Admitting: Emergency Medicine

## 2024-10-16 ENCOUNTER — Encounter (HOSPITAL_COMMUNITY): Admission: EM | Payer: Self-pay | Source: Home / Self Care | Attending: Emergency Medicine

## 2024-10-16 ENCOUNTER — Encounter (HOSPITAL_COMMUNITY): Payer: Self-pay

## 2024-10-16 ENCOUNTER — Emergency Department (HOSPITAL_COMMUNITY)

## 2024-10-16 ENCOUNTER — Other Ambulatory Visit: Payer: Self-pay

## 2024-10-16 DIAGNOSIS — I214 Non-ST elevation (NSTEMI) myocardial infarction: Secondary | ICD-10-CM

## 2024-10-16 DIAGNOSIS — R079 Chest pain, unspecified: Secondary | ICD-10-CM

## 2024-10-16 LAB — CBC WITH DIFFERENTIAL/PLATELET
Abs Immature Granulocytes: 0.1 10*3/uL — ABNORMAL HIGH (ref 0.00–0.07)
Basophils Absolute: 0.1 10*3/uL (ref 0.0–0.1)
Basophils Relative: 0 %
Eosinophils Absolute: 0 10*3/uL (ref 0.0–0.5)
Eosinophils Relative: 0 %
HCT: 44.4 % (ref 36.0–46.0)
Hemoglobin: 15 g/dL (ref 12.0–15.0)
Immature Granulocytes: 1 %
Lymphocytes Relative: 8 %
Lymphs Abs: 1.2 10*3/uL (ref 0.7–4.0)
MCH: 29.9 pg (ref 26.0–34.0)
MCHC: 33.8 g/dL (ref 30.0–36.0)
MCV: 88.4 fL (ref 80.0–100.0)
Monocytes Absolute: 1 10*3/uL (ref 0.1–1.0)
Monocytes Relative: 7 %
Neutro Abs: 13.6 10*3/uL — ABNORMAL HIGH (ref 1.7–7.7)
Neutrophils Relative %: 84 %
Platelets: 190 10*3/uL (ref 150–400)
RBC: 5.02 MIL/uL (ref 3.87–5.11)
RDW: 12.7 % (ref 11.5–15.5)
WBC: 16 10*3/uL — ABNORMAL HIGH (ref 4.0–10.5)
nRBC: 0 % (ref 0.0–0.2)

## 2024-10-16 LAB — COMPREHENSIVE METABOLIC PANEL WITH GFR
ALT: 28 U/L (ref 0–44)
AST: 81 U/L — ABNORMAL HIGH (ref 15–41)
Albumin: 4 g/dL (ref 3.5–5.0)
Alkaline Phosphatase: 79 U/L (ref 38–126)
Anion gap: 16 — ABNORMAL HIGH (ref 5–15)
BUN: 17 mg/dL (ref 8–23)
CO2: 17 mmol/L — ABNORMAL LOW (ref 22–32)
Calcium: 8.8 mg/dL — ABNORMAL LOW (ref 8.9–10.3)
Chloride: 103 mmol/L (ref 98–111)
Creatinine, Ser: 0.83 mg/dL (ref 0.44–1.00)
GFR, Estimated: >60 mL/min
Glucose, Bld: 137 mg/dL — ABNORMAL HIGH (ref 70–99)
Potassium: 4 mmol/L (ref 3.5–5.1)
Sodium: 136 mmol/L (ref 135–145)
Total Bilirubin: 0.5 mg/dL (ref 0.0–1.2)
Total Protein: 6.4 g/dL — ABNORMAL LOW (ref 6.5–8.1)

## 2024-10-16 LAB — I-STAT CHEM 8, ED
BUN: 19 mg/dL (ref 8–23)
Calcium, Ion: 1 mmol/L — ABNORMAL LOW (ref 1.15–1.40)
Chloride: 105 mmol/L (ref 98–111)
Creatinine, Ser: 0.8 mg/dL (ref 0.44–1.00)
Glucose, Bld: 140 mg/dL — ABNORMAL HIGH (ref 70–99)
HCT: 46 % (ref 36.0–46.0)
Hemoglobin: 15.6 g/dL — ABNORMAL HIGH (ref 12.0–15.0)
Potassium: 3.6 mmol/L (ref 3.5–5.1)
Sodium: 137 mmol/L (ref 135–145)
TCO2: 19 mmol/L — ABNORMAL LOW (ref 22–32)

## 2024-10-16 LAB — TROPONIN T, HIGH SENSITIVITY: Troponin T High Sensitivity: 1360 ng/L (ref 0–19)

## 2024-10-16 MED ORDER — HEPARIN (PORCINE) 25000 UT/250ML-% IV SOLN
750.0000 [IU]/h | INTRAVENOUS | Status: AC
  Administered 2024-10-16: 750 [IU]/h via INTRAVENOUS
  Filled 2024-10-16: qty 250

## 2024-10-16 MED ORDER — HEPARIN BOLUS VIA INFUSION: 4000.0000 [IU] | Freq: Once | INTRAVENOUS | Status: DC

## 2024-10-16 MED ORDER — HEPARIN SODIUM (PORCINE) 5000 UNIT/ML IJ SOLN
3500.0000 [IU] | Freq: Once | INTRAMUSCULAR | Status: AC
  Administered 2024-10-16: 3500 [IU] via INTRAVENOUS

## 2024-10-16 NOTE — ED Provider Notes (Cosign Needed)
 " [facility] Provider Note   CSN: [id number] Arrival date & time: 10/16/24  2157     Patient presents with: Code STEMI   Shelly [name] is a 84 y.o. 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  Patient reports that she was in her normal state of health and without any recent fevers, chills, cough or congestion.  Patient was out trying a break out of the ice and snow in her driveway when she developed chest pain.  This was approximately 4 PM.  She has had ongoing persistent pain since then.  EMS was subsequently called and on their initial evaluation noted to have ST elevations concerning for acute STEMI.  Patient was provided aspirin as well as nitroglycerin with subsequent resolution of symptoms.  HPI     Prior to Admission medications  Medication Sig Start Date End Date Taking? Authorizing Provider  acetaminophen  (TYLENOL ) 500 MG tablet Take 500 mg by mouth every 6 (six) hours as needed. For pain    [provider]  amLODipine  (NORVASC ) 10 MG tablet Take 1 tablet (10 mg total) by mouth daily. Patient needs office visit for further refills 05/23/18   Opalski, Barnie, DO  cephALEXin  (KEFLEX ) 500 MG capsule Take 1 capsule (500 mg total) by mouth 3 (three) times daily. 11/08/19   Petrucelli, Samantha R, PA-C  furosemide  (LASIX ) 20 MG tablet Take 1/4 tablet by mouth every 3rd day as needed for peripheral edema 11/14/17   Opalski, Deborah, DO  latanoprost (XALATAN) 0.005 % ophthalmic solution Place 1 drop into both eyes at bedtime.     [provider]  levothyroxine  (SYNTHROID , LEVOTHROID) 50 MCG tablet Take 1 tablet (50 mcg total) by mouth daily. 11/14/17   Opalski, Barnie, DO  ranitidine (ZANTAC) 150 MG capsule Take 150 mg daily as needed by mouth.     [provider]  timolol (TIMOPTIC) 0.5 % ophthalmic solution Place 1 drop  into both eyes every morning.     [provider]    Allergies: Aspirin, Iohexol, Naproxen, Penicillins, and Shellfish allergy    Review of Systems  Updated Vital Signs BP 131/82   Pulse 85   Temp 97.7 F (36.5 C) (Oral)   Resp 20   Ht 5' 4 (1.626 m)   Wt 59 kg   SpO2 99%   BMI 22.31 kg/m   Physical Exam HENT:     Head: Normocephalic and atraumatic.     Right Ear: External ear normal.     Left Ear: External ear normal.     Nose: Nose normal.     Mouth/Throat:     Mouth: Mucous membranes are moist.  Eyes:     Pupils: Pupils are equal, round, and reactive to light.  Cardiovascular:     Rate and Rhythm: Normal rate and regular rhythm.     Pulses: Normal pulses.  Pulmonary:     Effort: Pulmonary effort is normal.     Breath sounds: Normal breath sounds.  Abdominal:     General: Abdomen is flat.  Musculoskeletal:        General: Normal range of motion.  Skin:    General: Skin is warm.     Capillary Refill: Capillary refill takes less than 2 seconds.  Neurological:     General: No focal deficit present.     Mental Status: She is alert and oriented to person,  place, and time.  Psychiatric:        Mood and Affect: Mood normal.     (all labs ordered are listed, but only abnormal results are displayed) Labs Reviewed - No data to display  EKG: None  Radiology: No results found.  Procedures   Medications Ordered in the ED  heparin  injection 3,500 Units (has no administration in time range)    Clinical Course as of 10/16/24 2355  Fri Oct 16, 2024  2229 If trop negative x 2, go home with metop 25 mg every day, ASA every day, PRN nitro [DZ]    Clinical Course User Index [DZ] Laurita Sieving, MD                                Medical Decision Making Amount and/or Complexity of Data Reviewed Labs: ordered. Radiology: ordered.  Risk Prescription drug management.   Patient is an 84 year old female who presents today for evaluation of  STEMI.  On initial assessment patient was noted to be hemodynamically stable and afebrile.  On my bedside assessment patient was noted be resting comfortably, acute distress.  Patient was without any active pain at this point in time.  Physical examination without any acute abnormalities.  Patient's EKG here with evidence of ST elevations in the lateral as well as inferior leads.  Patient does have evidence of Q waves as well.  Patient was seen in conjunction with cardiology and on further review they felt that this was potentially related to older infarct.  Plan at this time is to defer emergent catheterization and will follow-up on troponins.  Will initiate heparin  at this point time.  During her time in the emergency department she did not have any recurrence of her pain.  Review of laboratory evaluation with leukocytosis of 16, I feel that this is reactionary given no additional infectious symptoms and no obvious source.  Patient's troponin was elevated at 1300.  Chest x-ray without any acute abnormalities.  I did speak with cardiology and they will made at this time for presumed NSTEMI.  No further intervention here in the emergency department.   Final diagnoses:  NSTEMI (non-ST elevated myocardial infarction) [facility])  Chest pain, unspecified type    ED Discharge Orders     None          Laurita Sieving, MD 10/16/24 2358  "

## 2024-10-16 NOTE — ED Triage Notes (Signed)
 Patient BIB [facility] from home as code STEMI. Patient states CP became constant around 4pm, called EMS at 9pm due CP not going away. Received 324 aspirin and 0.4 nitroglycerin in route. CP upon arrival 0/10. VSS A&Ox4.

## 2024-10-16 NOTE — Progress Notes (Signed)
 PHARMACY - ANTICOAGULATION CONSULT NOTE  Pharmacy Consult for heparin  Indication: chest pain/ACS  Allergies[1]  Patient Measurements: Height: 5' 4 (162.6 cm) Weight: 59 kg (130 lb) IBW/kg (Calculated) : 54.7 HEPARIN  DW (KG): 59  Vital Signs: Temp: 97.7 F (36.5 C) (02/06 2202) Temp Source: Oral (02/06 2202) BP: 151/80 (02/06 2245) Pulse Rate: 78 (02/06 2245)  Labs: Recent Labs    10/16/24 2215 10/16/24 2229  HGB 15.6* 15.0  HCT 46.0 44.4  PLT  --  190  CREATININE 0.80 0.83    Estimated Creatinine Clearance: 45.9 mL/min (by C-G formula based on SCr of 0.83 mg/dL).   Medical History: Past Medical History:  Diagnosis Date   Anxiety    Arthritis    Diverticulosis 04/21/2007   Gastritis 04/21/2007   GERD (gastroesophageal reflux disease)    Glaucoma    Hemorrhoids    Hiatal hernia 04/21/2007   History of angioedema    History of kidney stones    History of pneumonia    HLD (hyperlipidemia)    HTN (hypertension)    stress   IBS (irritable bowel syndrome)     Assessment: 43 YOF presenting as code STEMI, she is not on anticoagulation PTA, 3500 units of heparin  given upon arrival, CBC wnl  Goal of Therapy:  Heparin  level 0.3-0.7 units/ml Monitor platelets by anticoagulation protocol: Yes   Plan:  Heparin  gtt at 750 units/hr F/u 8 hour heparin  level F/u cards plan  Dorn Poot, PharmD, [facility] Clinical Pharmacist ED Pharmacist Phone # [phone] 10/16/2024 11:16 PM      [1]  Allergies Allergen Reactions   Aspirin Hives    Can take low dose aspirin   Iohexol      Code: HIVES, Desc: PT had IVP in 1992- developed severe hives several hours post injection., Onset Date: [id number]    Naproxen Swelling    Can take alieve in low doses   Penicillins Other (See Comments)    Unknown childhood allergy - can take amoxicillin   Shellfish Allergy Hives

## 2024-10-16 NOTE — ED Notes (Signed)
 Cardiology at bedside.
